# Patient Record
Sex: Female | Born: 1980 | Race: Black or African American | Hispanic: No | Smoking: Current every day smoker
Health system: Southern US, Community
[De-identification: ages and names within clinical notes are randomized; demographics above are authoritative.]

## PROBLEM LIST (undated history)

## (undated) HISTORY — PX: TUBAL LIGATION: SHX77

---

## 2006-06-20 ENCOUNTER — Emergency Department (HOSPITAL_COMMUNITY): Admission: EM | Admit: 2006-06-20 | Discharge: 2006-06-20 | Payer: Self-pay | Admitting: Emergency Medicine

## 2006-06-23 ENCOUNTER — Emergency Department (HOSPITAL_COMMUNITY): Admission: EM | Admit: 2006-06-23 | Discharge: 2006-06-23 | Payer: Self-pay | Admitting: Family Medicine

## 2007-01-01 ENCOUNTER — Ambulatory Visit (HOSPITAL_COMMUNITY): Admission: RE | Admit: 2007-01-01 | Discharge: 2007-01-01 | Payer: Self-pay | Admitting: Obstetrics & Gynecology

## 2007-02-02 ENCOUNTER — Ambulatory Visit (HOSPITAL_COMMUNITY): Admission: RE | Admit: 2007-02-02 | Discharge: 2007-02-02 | Payer: Self-pay | Admitting: Family Medicine

## 2007-03-02 ENCOUNTER — Ambulatory Visit (HOSPITAL_COMMUNITY): Admission: RE | Admit: 2007-03-02 | Discharge: 2007-03-02 | Payer: Self-pay | Admitting: Family Medicine

## 2007-03-18 ENCOUNTER — Ambulatory Visit (HOSPITAL_COMMUNITY): Admission: RE | Admit: 2007-03-18 | Discharge: 2007-03-18 | Payer: Self-pay | Admitting: Obstetrics & Gynecology

## 2007-04-30 ENCOUNTER — Inpatient Hospital Stay (HOSPITAL_COMMUNITY): Admission: AD | Admit: 2007-04-30 | Discharge: 2007-04-30 | Payer: Self-pay | Admitting: Family Medicine

## 2008-01-19 ENCOUNTER — Emergency Department (HOSPITAL_COMMUNITY): Admission: EM | Admit: 2008-01-19 | Discharge: 2008-01-19 | Payer: Self-pay | Admitting: Emergency Medicine

## 2008-07-09 ENCOUNTER — Emergency Department (HOSPITAL_COMMUNITY): Admission: EM | Admit: 2008-07-09 | Discharge: 2008-07-09 | Payer: Self-pay | Admitting: Emergency Medicine

## 2008-07-18 ENCOUNTER — Inpatient Hospital Stay (HOSPITAL_COMMUNITY): Admission: AD | Admit: 2008-07-18 | Discharge: 2008-07-18 | Payer: Self-pay | Admitting: Obstetrics & Gynecology

## 2008-08-03 ENCOUNTER — Inpatient Hospital Stay (HOSPITAL_COMMUNITY): Admission: AD | Admit: 2008-08-03 | Discharge: 2008-08-03 | Payer: Self-pay | Admitting: Family Medicine

## 2008-08-03 ENCOUNTER — Ambulatory Visit: Payer: Self-pay | Admitting: Obstetrics and Gynecology

## 2008-08-09 ENCOUNTER — Inpatient Hospital Stay (HOSPITAL_COMMUNITY): Admission: AD | Admit: 2008-08-09 | Discharge: 2008-08-09 | Payer: Self-pay | Admitting: Obstetrics & Gynecology

## 2008-08-19 ENCOUNTER — Emergency Department (HOSPITAL_COMMUNITY): Admission: EM | Admit: 2008-08-19 | Discharge: 2008-08-19 | Payer: Self-pay | Admitting: Emergency Medicine

## 2008-09-09 ENCOUNTER — Inpatient Hospital Stay (HOSPITAL_COMMUNITY): Admission: AD | Admit: 2008-09-09 | Discharge: 2008-09-09 | Payer: Self-pay | Admitting: Family Medicine

## 2008-10-25 ENCOUNTER — Ambulatory Visit (HOSPITAL_COMMUNITY): Admission: RE | Admit: 2008-10-25 | Discharge: 2008-10-25 | Payer: Self-pay | Admitting: Obstetrics & Gynecology

## 2008-12-06 ENCOUNTER — Emergency Department (HOSPITAL_COMMUNITY): Admission: EM | Admit: 2008-12-06 | Discharge: 2008-12-06 | Payer: Self-pay | Admitting: Emergency Medicine

## 2009-02-21 ENCOUNTER — Inpatient Hospital Stay (HOSPITAL_COMMUNITY): Admission: AD | Admit: 2009-02-21 | Discharge: 2009-02-21 | Payer: Self-pay | Admitting: Obstetrics & Gynecology

## 2009-02-21 ENCOUNTER — Ambulatory Visit: Payer: Self-pay | Admitting: Family Medicine

## 2009-03-22 ENCOUNTER — Inpatient Hospital Stay (HOSPITAL_COMMUNITY): Admission: AD | Admit: 2009-03-22 | Discharge: 2009-03-22 | Payer: Self-pay | Admitting: Obstetrics & Gynecology

## 2009-03-22 ENCOUNTER — Ambulatory Visit: Payer: Self-pay | Admitting: Advanced Practice Midwife

## 2009-03-23 ENCOUNTER — Inpatient Hospital Stay (HOSPITAL_COMMUNITY): Admission: AD | Admit: 2009-03-23 | Discharge: 2009-03-26 | Payer: Self-pay | Admitting: Obstetrics & Gynecology

## 2009-03-23 ENCOUNTER — Ambulatory Visit: Payer: Self-pay | Admitting: Family Medicine

## 2009-03-24 ENCOUNTER — Encounter: Payer: Self-pay | Admitting: Obstetrics & Gynecology

## 2010-08-21 LAB — CBC
HCT: 35.1 % — ABNORMAL LOW (ref 36.0–46.0)
Hemoglobin: 12 g/dL (ref 12.0–15.0)
MCV: 94.4 fL (ref 78.0–100.0)
RBC: 3.72 MIL/uL — ABNORMAL LOW (ref 3.87–5.11)
RDW: 13.5 % (ref 11.5–15.5)
RDW: 13.7 % (ref 11.5–15.5)
WBC: 8.7 10*3/uL (ref 4.0–10.5)

## 2010-08-21 LAB — RPR: RPR Ser Ql: NONREACTIVE

## 2010-08-21 LAB — RAPID URINE DRUG SCREEN, HOSP PERFORMED
Amphetamines: NOT DETECTED
Cocaine: NOT DETECTED
Tetrahydrocannabinol: NOT DETECTED

## 2010-08-22 LAB — CBC
HCT: 34.2 % — ABNORMAL LOW (ref 36.0–46.0)
Hemoglobin: 11.6 g/dL — ABNORMAL LOW (ref 12.0–15.0)
MCHC: 33.8 g/dL (ref 30.0–36.0)
MCV: 95.5 fL (ref 78.0–100.0)
RBC: 3.58 MIL/uL — ABNORMAL LOW (ref 3.87–5.11)

## 2010-08-22 LAB — URINALYSIS, ROUTINE W REFLEX MICROSCOPIC
Bilirubin Urine: NEGATIVE
Glucose, UA: NEGATIVE mg/dL
Hgb urine dipstick: NEGATIVE
Ketones, ur: NEGATIVE mg/dL
Protein, ur: NEGATIVE mg/dL
pH: 6.5 (ref 5.0–8.0)

## 2010-08-22 LAB — COMPREHENSIVE METABOLIC PANEL
ALT: 11 U/L (ref 0–35)
AST: 17 U/L (ref 0–37)
Albumin: 3 g/dL — ABNORMAL LOW (ref 3.5–5.2)
Alkaline Phosphatase: 51 U/L (ref 39–117)
Calcium: 9 mg/dL (ref 8.4–10.5)
Chloride: 104 mEq/L (ref 96–112)
Creatinine, Ser: 0.42 mg/dL (ref 0.4–1.2)
Sodium: 135 mEq/L (ref 135–145)
Total Bilirubin: 0.4 mg/dL (ref 0.3–1.2)

## 2010-08-28 LAB — ABO/RH: ABO/RH(D): A POS

## 2010-08-28 LAB — DRUG SCREEN PANEL (SERUM)
Amphetamine Scrn: NEGATIVE
Benzodiazepine Scrn: NEGATIVE
Methadone (Dolophine), Serum: NEGATIVE
Propoxyphene,Serum: NEGATIVE

## 2010-08-28 LAB — HCG, QUANTITATIVE, PREGNANCY: hCG, Beta Chain, Quant, S: 92799 m[IU]/mL — ABNORMAL HIGH (ref <5)

## 2010-08-28 LAB — ETHANOL: Alcohol, Ethyl (B): 128 mg/dL — ABNORMAL HIGH (ref 0–10)

## 2010-08-29 LAB — URINALYSIS, ROUTINE W REFLEX MICROSCOPIC
Bilirubin Urine: NEGATIVE
Glucose, UA: NEGATIVE mg/dL
Ketones, ur: NEGATIVE mg/dL
Nitrite: NEGATIVE
Protein, ur: NEGATIVE mg/dL
Protein, ur: NEGATIVE mg/dL
Urobilinogen, UA: 1 mg/dL (ref 0.0–1.0)
pH: 6 (ref 5.0–8.0)

## 2010-08-29 LAB — WET PREP, GENITAL
Clue Cells Wet Prep HPF POC: NONE SEEN
Trich, Wet Prep: NONE SEEN
Yeast Wet Prep HPF POC: NONE SEEN

## 2010-08-29 LAB — GC/CHLAMYDIA PROBE AMP, GENITAL
Chlamydia, DNA Probe: NEGATIVE
GC Probe Amp, Genital: NEGATIVE

## 2010-11-24 ENCOUNTER — Emergency Department: Payer: Self-pay | Admitting: Emergency Medicine

## 2010-12-27 ENCOUNTER — Emergency Department: Payer: Self-pay | Admitting: Emergency Medicine

## 2011-02-24 LAB — URINALYSIS, ROUTINE W REFLEX MICROSCOPIC
Ketones, ur: NEGATIVE
Nitrite: NEGATIVE
Protein, ur: NEGATIVE
Urobilinogen, UA: 0.2

## 2011-02-24 LAB — URINE MICROSCOPIC-ADD ON

## 2011-07-06 ENCOUNTER — Ambulatory Visit: Payer: Self-pay

## 2011-07-06 LAB — DRUG SCREEN, URINE
Amphetamines, Ur Screen: NEGATIVE (ref ?–1000)
Barbiturates, Ur Screen: NEGATIVE (ref ?–200)
Cocaine Metabolite,Ur ~~LOC~~: NEGATIVE (ref ?–300)
Tricyclic, Ur Screen: NEGATIVE (ref ?–1000)

## 2011-07-06 LAB — PREGNANCY, URINE: Pregnancy Test, Urine: NEGATIVE m[IU]/mL

## 2011-09-15 ENCOUNTER — Emergency Department: Payer: Self-pay | Admitting: Emergency Medicine

## 2011-09-15 LAB — URINALYSIS, COMPLETE
Nitrite: NEGATIVE
Ph: 5 (ref 4.5–8.0)
RBC,UR: 962 /HPF (ref 0–5)
Squamous Epithelial: 4
WBC UR: 52 /HPF (ref 0–5)

## 2011-09-15 LAB — COMPREHENSIVE METABOLIC PANEL
Calcium, Total: 8.3 mg/dL — ABNORMAL LOW (ref 8.5–10.1)
EGFR (Non-African Amer.): >60
Glucose: 87 mg/dL (ref 65–99)
Sodium: 142 mmol/L (ref 136–145)
Total Protein: 6.6 g/dL (ref 6.4–8.2)

## 2011-09-15 LAB — PREGNANCY, URINE: Pregnancy Test, Urine: NEGATIVE m[IU]/mL

## 2011-09-15 LAB — CBC
HCT: 35.2 % (ref 35.0–47.0)
MCV: 94 fL (ref 80–100)

## 2011-09-15 LAB — LIPASE, BLOOD: Lipase: 109 U/L (ref 73–393)

## 2012-07-17 ENCOUNTER — Emergency Department: Payer: Self-pay | Admitting: Emergency Medicine

## 2012-08-13 ENCOUNTER — Emergency Department: Payer: Self-pay | Admitting: Emergency Medicine

## 2012-08-13 LAB — URINALYSIS, COMPLETE
Blood: NEGATIVE
Ph: 7 (ref 4.5–8.0)
Squamous Epithelial: 5

## 2012-08-13 LAB — WET PREP, GENITAL

## 2012-09-05 ENCOUNTER — Emergency Department: Payer: Self-pay | Admitting: Emergency Medicine

## 2012-09-05 LAB — URINALYSIS, COMPLETE
Bilirubin,UR: NEGATIVE
Glucose,UR: NEGATIVE mg/dL (ref 0–75)
Nitrite: NEGATIVE
WBC UR: 1 /HPF (ref 0–5)

## 2012-09-05 LAB — WET PREP, GENITAL

## 2012-10-09 ENCOUNTER — Emergency Department: Payer: Self-pay | Admitting: Emergency Medicine

## 2012-10-09 LAB — WET PREP, GENITAL

## 2012-10-09 LAB — URINALYSIS, COMPLETE
Protein: NEGATIVE
Squamous Epithelial: 2
WBC UR: 2 /HPF (ref 0–5)

## 2012-11-10 ENCOUNTER — Emergency Department: Payer: Self-pay | Admitting: Internal Medicine

## 2013-01-11 ENCOUNTER — Observation Stay: Payer: Self-pay | Admitting: Internal Medicine

## 2013-01-11 LAB — BASIC METABOLIC PANEL
Chloride: 115 mmol/L — ABNORMAL HIGH (ref 98–107)
Co2: 24 mmol/L (ref 21–32)
EGFR (Non-African Amer.): >60
Glucose: 91 mg/dL (ref 65–99)
Sodium: 146 mmol/L — ABNORMAL HIGH (ref 136–145)

## 2013-01-11 LAB — URINALYSIS, COMPLETE
Bilirubin,UR: NEGATIVE
Glucose,UR: NEGATIVE mg/dL (ref 0–75)
Nitrite: NEGATIVE
WBC UR: 4 /HPF (ref 0–5)

## 2013-01-11 LAB — DRUG SCREEN, URINE
Amphetamines, Ur Screen: NEGATIVE (ref ?–1000)
Barbiturates, Ur Screen: NEGATIVE (ref ?–200)
Benzodiazepine, Ur Scrn: NEGATIVE (ref ?–200)
Cannabinoid 50 Ng, Ur ~~LOC~~: POSITIVE (ref ?–50)
MDMA (Ecstasy)Ur Screen: NEGATIVE (ref ?–500)
Opiate, Ur Screen: NEGATIVE (ref ?–300)

## 2013-01-11 LAB — ETHANOL: Ethanol %: 0.151 % — ABNORMAL HIGH (ref 0.000–0.080)

## 2013-01-11 LAB — CBC
HCT: 34.9 % — ABNORMAL LOW (ref 35.0–47.0)
MCV: 98 fL (ref 80–100)
WBC: 7.2 10*3/uL (ref 3.6–11.0)

## 2013-01-11 LAB — TROPONIN I: Troponin-I: <0.02 ng/mL

## 2013-01-12 LAB — CBC WITH DIFFERENTIAL/PLATELET
Basophil #: 0 10*3/uL (ref 0.0–0.1)
Basophil %: 0.5 %
Eosinophil #: 0.2 10*3/uL (ref 0.0–0.7)
Lymphocyte %: 36.3 %
MCV: 96 fL (ref 80–100)
Monocyte %: 7.4 %
Neutrophil #: 3.8 10*3/uL (ref 1.4–6.5)
Platelet: 154 10*3/uL (ref 150–440)
RBC: 3.27 10*6/uL — ABNORMAL LOW (ref 3.80–5.20)
RDW: 12.7 % (ref 11.5–14.5)
WBC: 7.1 10*3/uL (ref 3.6–11.0)

## 2013-01-12 LAB — COMPREHENSIVE METABOLIC PANEL
Alkaline Phosphatase: 31 U/L — ABNORMAL LOW (ref 50–136)
Co2: 25 mmol/L (ref 21–32)
Creatinine: 0.55 mg/dL — ABNORMAL LOW (ref 0.60–1.30)
EGFR (African American): >60
EGFR (Non-African Amer.): >60
Glucose: 105 mg/dL — ABNORMAL HIGH (ref 65–99)
Osmolality: 278 (ref 275–301)
SGOT(AST): 20 U/L (ref 15–37)
Sodium: 140 mmol/L (ref 136–145)
Total Protein: 5 g/dL — ABNORMAL LOW (ref 6.4–8.2)

## 2013-01-12 LAB — URINE CULTURE
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
## 2014-09-08 NOTE — Discharge Summary (Signed)
PATIENT NAME:  Alexandra Kennedy, Alexandra Kennedy MR#:  [id number] DATE OF BIRTH:  [birth year]  DATE OF ADMISSION:  01/11/2013 DATE OF DISCHARGE:  01/12/2013  ADMITTING DIAGNOSIS: Syncope.     DISCHARGE DIAGNOSES 1.  Recurrent syncope likely hypotension-related.  2.  Hypotension, suspected due to drugs, questionable muscle relaxant use. Tobacco, alcohol as well as cannabis abuse.  3.  Hyponatremia, likely due to dehydration, resolved on IV fluids.  4.  Metabolic encephalopathy due to drugs.  5.  Depression.  6.  Anemia of dehydration.  7.  Pyuria but no obvious urinary tract infection.    8.  Mood disorder, not otherwise specified, and polysubstance dependence.   DISCHARGE CONDITION: Stable.   DISCHARGE MEDICATIONS 1.  The patient is to resume ibuprofen 600 mg p.o. 3 times daily as needed.  2.  Iron with folic acid once daily.  3.  Chlordiazepoxide which is Librium 25 mg every 8 hours as needed.  4.  Nicotine oral inhaler 10 mg inhaler every 1 to 2 hours as needed.  5.  Nicotine transdermal patch 14 mg transdermally once daily.  6.  Folic acid 1 mg p.o. once daily.  7.  Thiamine 100 mg p.o. once daily.   HOME OXYGEN: None.   DIET: 2 grams salt.   ACTIVITY LIMITATIONS: As tolerated.    FOLLOWUP APPOINTMENT: [facility] in 2 days after discharge.   CONSULTANTS:  Dr. Brandy HaleUzma Faheem of psychiatry.  Care management.   RADIOLOGIC STUDIES: Chest x-ray, PA and lateral, 26th of August 2014, revealed no acute disease of the chest. CT scan of head without contrast 26th of August 2014, showed no evidence of acute intracranial abnormality. An echocardiogram on 26th of August 2014, revealed left ventricular ejection fraction by visual estimation 60% to 65%, normal global left ventricular systolic function.   HISTORY OF PRESENT ILLNESS:  The patient is a 34 year old African American female with past medical history significant for history of depression as well as anemia, who presented to the hospital with  complaints of a few syncopal episodes. Apparently, the patient was drinking alcohol and she syncopized a few times. She was brought to the Emergency Room where she was noted to be hypotensive. She was given IV fluids, however, her blood pressure did not improve so hospitalist services were contacted for admission. On arrival to the Emergency Room, immediately she was afebrile, temperature was 98.2, pulse was 81, respiratory rate was 20, blood pressure 91/55, saturations were 97% on room air. Physical exam was unremarkable.  The patient's lab data done in the Emergency Room showed elevation of sodium 146, otherwise BMP was unremarkable. Alcohol level was 151. The patient's cardiac enzymes x 3 were within normal limits. Urine drug screen was positive for cannabinoids. CBC: White blood cell count was 7.2, hemoglobin was 12.0, platelet count 156. Urinalysis revealed hazy urine, 3 red blood cells, trace leukocyte esterase, 4 white blood cells. Urine cultures showed no growth. Chest x-ray was unremarkable as well as CT of head.   HOSPITAL COURSE 1.  The patient was admitted to the hospital for further evaluation for her syncope. The patient had orthostatic vital signs checked for syncope and those were unremarkable. It was felt that patient's syncope could have been related to alcohol or substance abuse as well as possibly hypotension related to polysubstance abuse. The patient was rehydrated and had echocardiogram done which was unremarkable. It was felt that she is safe to be discharged home.  2.  In regards to hypotension, again it was felt  to be likely dehydration as well as medication-related or drug-related. She was rehydrated and her blood pressure improved. On the day of discharge, the patient's vital signs: Temperature is 98, pulse was 73, respiratory rate was 18, blood pressure 114/79, saturation was 99% to 100% on room air at rest.  3.  In regards to polysubstance, we consulted the specialist,  psychiatrist. Dr. Garnetta Buddy saw the patient in consultation on the 26th of August 2014, felt the patient had some mood disorder not otherwise specified as well as polysubstance dependence. She discussed with the patient about medications as well as use of drugs as well as alcohol; however, the patient was minimizing them.  She recommended to start the patient on Librium 25 mg p.o. every 8 hours for the next 2 days to help her to withdraw from alcohol. The patient, however, refused medications for anxiety or depression and declined referral to a rehabilitation facility and reported that she was using drugs only on p.r.n. basis. Dr. Garnetta Buddy felt that the patient is stable to be discharged home whenever she is medically stable.  4.  The patient had altered mental status, somnolence on arrival to the Emergency Room. Again, it was felt to be due to alcohol as well as drugs as well as hypotension.  Her metabolic encephalopathy resolved.  5.  In regards to anemia, we noted that she was more anemic with rehydration. Hemoglobin level drifted down from 12.0 to 10.9. However, no active bleeding was noted and guaiac was ordered, however, not obtained at the time of discharge. It is recommended to follow the patient's hemoglobin levels as outpatient and make decisions about evaluating her. She is, however, to continue her iron supplementation as she was previously on.  6.  In regards to her questionable urinary tract infection, it was felt that the patient has just pyuria. Urine cultures came back negative. No infection was noted and no antibiotic therapy was initiated.   The patient is being discharged in stable condition with the above-mentioned medications and followup. Her vital signs were stable.   TIME SPENT: 40 minutes.    ____________________________ Katharina Caper, MD rv:cs D: 01/12/2013 14:16:52 ET T: 01/12/2013 18:58:28 ET JOB#: [id number]  cc: Katharina Caper, MD, <Dictator> [facility]   Venita Seng  MD ELECTRONICALLY SIGNED 02/05/2013 18:00
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
## 2016-08-25 ENCOUNTER — Emergency Department
Admission: EM | Admit: 2016-08-25 | Discharge: 2016-08-25 | Disposition: A | Discharge status: Home / Self Care | Payer: Self-pay | Attending: Emergency Medicine | Admitting: Emergency Medicine

## 2016-08-25 ENCOUNTER — Encounter: Payer: Self-pay | Admitting: Emergency Medicine

## 2016-08-25 ENCOUNTER — Emergency Department: Payer: Self-pay

## 2016-08-25 DIAGNOSIS — M62838 Other muscle spasm: Secondary | ICD-10-CM | POA: Insufficient documentation

## 2016-08-25 DIAGNOSIS — M5412 Radiculopathy, cervical region: Secondary | ICD-10-CM | POA: Insufficient documentation

## 2016-08-25 DIAGNOSIS — F17200 Nicotine dependence, unspecified, uncomplicated: Secondary | ICD-10-CM | POA: Insufficient documentation

## 2016-08-25 MED ORDER — CYCLOBENZAPRINE HCL 10 MG PO TABS: 10.0000 mg | ORAL_TABLET | Freq: Three times a day (TID) | ORAL | 0 refills | Status: DC | PRN

## 2016-08-25 MED ORDER — METHYLPREDNISOLONE 4 MG PO TBPK: ORAL_TABLET | ORAL | 0 refills | Status: DC

## 2016-08-25 MED ORDER — TRAMADOL HCL 50 MG PO TABS: 50.0000 mg | ORAL_TABLET | Freq: Four times a day (QID) | ORAL | 0 refills | Status: DC | PRN

## 2016-08-25 NOTE — ED Triage Notes (Signed)
States she developed pain to right side of neck which radiates into right arm for the past 3 days   Denies any injury

## 2016-08-25 NOTE — ED Provider Notes (Signed)
[facility] Emergency Department Provider Note   ____________________________________________   First MD Initiated Contact with Patient 08/25/16 1243     (approximate)  I have reviewed the triage vital signs and the nursing notes.   HISTORY  Chief Complaint Neck Pain    HPI Alexandra Boltz is a 36 y.o. female patient complaining of radicular neck pain to the upper extremity for 3 days. Patient denies any provocative incident for her complaint.She denies pain but states the discomfort from the tingling and numbness as 8/10. No palliative measures taken for this complaint.   History reviewed. No pertinent past medical history.  There are no active problems to display for this patient.   History reviewed. No pertinent surgical history.  Prior to Admission medications   Medication Sig Start Date End Date Taking? Authorizing Provider  cyclobenzaprine (FLEXERIL) 10 MG tablet Take 1 tablet (10 mg total) by mouth 3 (three) times daily as needed. 08/25/16   Joni Reining, PA-C  methylPREDNISolone (MEDROL DOSEPAK) 4 MG TBPK tablet Take Tapered dose as directed 08/25/16   Joni Reining, PA-C  traMADol (ULTRAM) 50 MG tablet Take 1 tablet (50 mg total) by mouth every 6 (six) hours as needed for moderate pain. 08/25/16   Joni Reining, PA-C    Allergies Patient has no known allergies.  No family history on file.  Social History Social History  Substance Use Topics  . Smoking status: Current Every Day Smoker  . Smokeless tobacco: Never Used  . Alcohol use Yes    Review of Systems Constitutional: No fever/chills Eyes: No visual changes. ENT: No sore throat. Cardiovascular: Denies chest pain. Respiratory: Denies shortness of breath. Gastrointestinal: No abdominal pain.  No nausea, no vomiting.  No diarrhea.  No constipation. Genitourinary: Negative for dysuria. Musculoskeletal: Negative for back pain. Skin: Negative for rash. Neurological: Negative for  headaches or focal weakness. Numbness to the right upper extremity    ____________________________________________   PHYSICAL EXAM:  VITAL SIGNS: ED Triage Vitals  Enc Vitals Group     BP 08/25/16 1241 (!) 95/57     Pulse Rate 08/25/16 1241 87     Resp 08/25/16 1241 15     Temp 08/25/16 1241 98.3 F (36.8 C)     Temp Source 08/25/16 1241 Oral     SpO2 08/25/16 1241 100 %     Weight 08/25/16 1224 138 lb (62.6 kg)     Height 08/25/16 1224  (1.651 m)     Head Circumference --      Peak Flow --      Pain Score 08/25/16 1223 8     Pain Loc --      Pain Edu? --      Excl. in GC? --     Constitutional: Alert and oriented. Well appearing and in no acute distress. Eyes: Conjunctivae are normal. PERRL. EOMI. Head: Atraumatic. Nose: No congestion/rhinnorhea. Mouth/Throat: Mucous membranes are moist.  Oropharynx non-erythematous. Neck: No stridor.  No cervical spine tenderness to palpation. Hematological/Lymphatic/Immunilogical: No cervical lymphadenopathy. Cardiovascular: Normal rate, regular rhythm. Grossly normal heart sounds.  Good peripheral circulation. Respiratory: Normal respiratory effort.  No retractions. Lungs CTAB. Gastrointestinal: Soft and nontender. No distention. No abdominal bruits. No CVA tenderness. Musculoskeletal: No lower extremity tenderness nor edema.  No joint effusions. Neurologic:  Normal speech and language. No gross focal neurologic deficits are appreciated. No gait instability. Skin:  Skin is warm, dry and intact. No rash noted. Psychiatric: Mood and affect are  normal. Speech and behavior are normal.  ____________________________________________   LABS (all labs ordered are listed, but only abnormal results are displayed)  Labs Reviewed - No data to display ____________________________________________  EKG   ____________________________________________  RADIOLOGY   __. Acute findings. There is reverse of the cervical lordosis with  moderate degenerative changes in the cervical disc.. __________________________________________   PROCEDURES  Procedure(s) performed: None  Procedures  Critical Care performed: No  ____________________________________________   INITIAL IMPRESSION / ASSESSMENT AND PLAN / ED COURSE  Pertinent labs & imaging results that were available during my care of the patient were reviewed by me and considered in my medical decision making (see chart for details).  Radicular neck pain to right upper extremity. X-rays pending.      ____________________________________________   FINAL CLINICAL IMPRESSION(S) / ED DIAGNOSES  Final diagnoses:  Cervical radiculopathy  Muscle spasms of neck  Patient given discharge care instructions. Patient given a prescription for tramadol, Flexeril, Medrol Dosepak. Patient given a work note and advised follow-up with family doctor for continued care.    NEW MEDICATIONS STARTED DURING THIS VISIT:  New Prescriptions   CYCLOBENZAPRINE (FLEXERIL) 10 MG TABLET    Take 1 tablet (10 mg total) by mouth 3 (three) times daily as needed.   METHYLPREDNISOLONE (MEDROL DOSEPAK) 4 MG TBPK TABLET    Take Tapered dose as directed   TRAMADOL (ULTRAM) 50 MG TABLET    Take 1 tablet (50 mg total) by mouth every 6 (six) hours as needed for moderate pain.     Note:  This document was prepared using Dragon voice recognition software and may include unintentional dictation errors.    Joni Reining, PA-C 08/25/16 1357    Rockne Menghini, MD 08/25/16 1524

## 2017-05-14 ENCOUNTER — Emergency Department (HOSPITAL_COMMUNITY)
Admission: EM | Admit: 2017-05-14 | Discharge: 2017-05-14 | Disposition: A | Discharge status: Home / Self Care | Payer: Self-pay | Attending: Emergency Medicine | Admitting: Emergency Medicine

## 2017-05-14 ENCOUNTER — Encounter (HOSPITAL_COMMUNITY): Payer: Self-pay | Admitting: Emergency Medicine

## 2017-05-14 ENCOUNTER — Other Ambulatory Visit: Payer: Self-pay

## 2017-05-14 DIAGNOSIS — N898 Other specified noninflammatory disorders of vagina: Secondary | ICD-10-CM

## 2017-05-14 DIAGNOSIS — Z202 Contact with and (suspected) exposure to infections with a predominantly sexual mode of transmission: Secondary | ICD-10-CM | POA: Insufficient documentation

## 2017-05-14 DIAGNOSIS — N760 Acute vaginitis: Secondary | ICD-10-CM | POA: Insufficient documentation

## 2017-05-14 DIAGNOSIS — F17210 Nicotine dependence, cigarettes, uncomplicated: Secondary | ICD-10-CM | POA: Insufficient documentation

## 2017-05-14 DIAGNOSIS — Z79899 Other long term (current) drug therapy: Secondary | ICD-10-CM | POA: Insufficient documentation

## 2017-05-14 DIAGNOSIS — B9689 Other specified bacterial agents as the cause of diseases classified elsewhere: Secondary | ICD-10-CM

## 2017-05-14 LAB — URINALYSIS, COMPLETE (UACMP) WITH MICROSCOPIC
BACTERIA UA: NONE SEEN
BILIRUBIN URINE: NEGATIVE
Glucose, UA: NEGATIVE mg/dL
HGB URINE DIPSTICK: NEGATIVE
KETONES UR: 5 mg/dL — AB
LEUKOCYTES UA: NEGATIVE
NITRITE: NEGATIVE
PH: 6 (ref 5.0–8.0)
Protein, ur: NEGATIVE mg/dL
SPECIFIC GRAVITY, URINE: 1.021 (ref 1.005–1.030)

## 2017-05-14 LAB — WET PREP, GENITAL
SPERM: NONE SEEN
TRICH WET PREP: NONE SEEN
Yeast Wet Prep HPF POC: NONE SEEN

## 2017-05-14 LAB — PREGNANCY, URINE: Preg Test, Ur: NEGATIVE

## 2017-05-14 MED ORDER — TERCONAZOLE 0.4 % VA CREA: 1.0000 | TOPICAL_CREAM | Freq: Every day | VAGINAL | 0 refills | Status: DC

## 2017-05-14 MED ORDER — LIDOCAINE HCL (PF) 1 % IJ SOLN
INTRAMUSCULAR | Status: AC
  Administered 2017-05-14: 5 mL
  Filled 2017-05-14: qty 5

## 2017-05-14 MED ORDER — FLUCONAZOLE 150 MG PO TABS: 150.0000 mg | ORAL_TABLET | Freq: Once | ORAL | Status: DC

## 2017-05-14 MED ORDER — CEFTRIAXONE SODIUM 250 MG IJ SOLR
250.0000 mg | Freq: Once | INTRAMUSCULAR | Status: AC
  Administered 2017-05-14: 250 mg via INTRAMUSCULAR
  Filled 2017-05-14: qty 250

## 2017-05-14 MED ORDER — METRONIDAZOLE 500 MG PO TABS: 500.0000 mg | ORAL_TABLET | Freq: Two times a day (BID) | ORAL | 0 refills | Status: DC

## 2017-05-14 MED ORDER — AZITHROMYCIN 250 MG PO TABS
1000.0000 mg | ORAL_TABLET | Freq: Once | ORAL | Status: AC
  Administered 2017-05-14: 1000 mg via ORAL
  Filled 2017-05-14: qty 4

## 2017-05-14 NOTE — Discharge Instructions (Signed)
Someone will call you if your cultures come back positive. Follow up with the health department for additional STD screening.

## 2017-05-14 NOTE — ED Notes (Signed)
Awaiting pharmacy verification on fluconazole

## 2017-05-14 NOTE — ED Triage Notes (Signed)
Pt states she had sex a few weeks ago and states she now has had abnormal discharge for 1 week and itching.

## 2017-05-14 NOTE — ED Provider Notes (Signed)
[facility] Provider Note   CSN: [id number] Arrival date & time: 05/14/17  1656     History   Chief Complaint Chief Complaint  Patient presents with  . SEXUALLY TRANSMITTED DISEASE    HPI Alexandra Kennedy is a 36 y.o. female who presents to the ED for STD check. Patient reports that she had sex a few weeks ago and now has abnormal vaginal discharge and itching that started a week ago and has gotten worse. Patient describes the discharge as thick white. G5 P4, SAB x1. Hx of trichomonas. Current sex partner x 4 months.  HPI  History reviewed. No pertinent past medical history.  There are no active problems to display for this patient.   History reviewed. No pertinent surgical history.  OB History    No data available       Home Medications    Prior to Admission medications   Medication Sig Start Date End Date Taking? Authorizing Provider  cyclobenzaprine (FLEXERIL) 10 MG tablet Take 1 tablet (10 mg total) by mouth 3 (three) times daily as needed. 08/25/16   Joni ReiningSmith, Ronald K, PA-C  methylPREDNISolone (MEDROL DOSEPAK) 4 MG TBPK tablet Take Tapered dose as directed 08/25/16   Joni ReiningSmith, Ronald K, PA-C  metroNIDAZOLE (FLAGYL) 500 MG tablet Take 1 tablet (500 mg total) by mouth 2 (two) times daily. 05/14/17   Janne NapoleonNeese, Mialani Reicks M, NP  traMADol (ULTRAM) 50 MG tablet Take 1 tablet (50 mg total) by mouth every 6 (six) hours as needed for moderate pain. 08/25/16   Joni ReiningSmith, Ronald K, PA-C    Family History No family history on file.  Social History Social History   Tobacco Use  . Smoking status: Current Every Day Smoker  . Smokeless tobacco: Never Used  Substance Use Topics  . Alcohol use: Yes  . Drug use: Not on file     Allergies   Patient has no known allergies.   Review of Systems Review of Systems  Genitourinary: Positive for vaginal discharge. Vaginal pain: itching.  Musculoskeletal: Negative for back pain.  All other systems reviewed and  are negative.    Physical Exam Updated Vital Signs Ht 5' 5.5" (1.664 m)   Wt 64.9 kg (143 lb)   LMP 04/18/2017   BMI 23.43 kg/m   Physical Exam  Constitutional: She is oriented to person, place, and time. She appears well-developed and well-nourished. No distress.  HENT:  Head: Normocephalic.  Eyes: EOM are normal.  Neck: Neck supple.  Cardiovascular: Normal rate.  Pulmonary/Chest: Effort normal.  Abdominal: Soft. There is no tenderness.  Genitourinary:  Genitourinary Comments: External genitalia without lesions, thick, white, cheesy d/c vaginal vault. Mild CMT, no adnexal mass or tenderness. Uterus without palpable enlargement.   Musculoskeletal: Normal range of motion.  Neurological: She is alert and oriented to person, place, and time. No cranial nerve deficit.  Skin: Skin is warm and dry.  Psychiatric: She has a normal mood and affect.  Nursing note and vitals reviewed.    ED Treatments / Results  Labs (all labs ordered are listed, but only abnormal results are displayed) Labs Reviewed  WET PREP, GENITAL - Abnormal; Notable for the following components:      Result Value   Clue Cells Wet Prep HPF POC PRESENT (*)    WBC, Wet Prep HPF POC MODERATE (*)    All other components within normal limits  URINALYSIS, COMPLETE (UACMP) WITH MICROSCOPIC - Abnormal; Notable for the following components:   Ketones, ur  5 (*)    Squamous Epithelial / LPF 0-5 (*)    All other components within normal limits  PREGNANCY, URINE  RPR  HIV ANTIBODY (ROUTINE TESTING)  GC/CHLAMYDIA PROBE AMP ([facility]    Radiology No results found.  Procedures Procedures (including critical care time)  Medications Ordered in ED Medications  fluconazole (DIFLUCAN) tablet 150 mg (not administered)  cefTRIAXone (ROCEPHIN) injection 250 mg (not administered)  azithromycin (ZITHROMAX) tablet 1,000 mg (not administered)     Initial Impression / Assessment and Plan / ED Course  I  have reviewed the triage vital signs and the nursing notes. Pt presents with concerns for possible STD.  Pt understands that they have GC/Chlamydia cultures pending and that they will need to inform all sexual partners if results return positive. Pt has been treated prophylactically with azithromycin and Rocephin due to pts history, pelvic exam, and wet prep with increased WBCs.  Pt not concerning for PID because hemodynamically stable and minimal cervical motion tenderness on pelvic exam. Pt has also been treated with Flagyl for Bacterial Vaginosis. Pt has been advised to not drink alcohol while on this medication.  Patient to be discharged with instructions to follow up with [facility]. Discussed importance of using protection when sexually active.   Final Clinical Impressions(s) / ED Diagnoses   Final diagnoses:  BV (bacterial vaginosis)  Vaginal discharge  Possible exposure to STD    ED Discharge Orders        Ordered    metroNIDAZOLE (FLAGYL) 500 MG tablet  2 times daily     05/14/17 1759       Kerrie Buffaloeese, Amine Adelson [place], NP 05/14/17 1806    Jacalyn LefevreHaviland, Julie, MD 05/14/17 [phone]

## 2017-05-15 LAB — GC/CHLAMYDIA PROBE AMP (~~LOC~~) NOT AT ARMC
Chlamydia: NEGATIVE
Neisseria Gonorrhea: NEGATIVE

## 2017-05-15 LAB — HIV ANTIBODY (ROUTINE TESTING W REFLEX): HIV SCREEN 4TH GENERATION: NONREACTIVE

## 2017-05-15 LAB — RPR: RPR: NONREACTIVE

## 2018-06-17 ENCOUNTER — Emergency Department (HOSPITAL_COMMUNITY)
Admission: EM | Admit: 2018-06-17 | Discharge: 2018-06-17 | Disposition: A | Discharge status: Home / Self Care | Payer: Self-pay | Attending: Emergency Medicine | Admitting: Emergency Medicine

## 2018-06-17 ENCOUNTER — Encounter (HOSPITAL_COMMUNITY): Payer: Self-pay | Admitting: Emergency Medicine

## 2018-06-17 ENCOUNTER — Other Ambulatory Visit: Payer: Self-pay

## 2018-06-17 DIAGNOSIS — J029 Acute pharyngitis, unspecified: Secondary | ICD-10-CM | POA: Insufficient documentation

## 2018-06-17 DIAGNOSIS — Z79899 Other long term (current) drug therapy: Secondary | ICD-10-CM | POA: Insufficient documentation

## 2018-06-17 DIAGNOSIS — F17210 Nicotine dependence, cigarettes, uncomplicated: Secondary | ICD-10-CM | POA: Insufficient documentation

## 2018-06-17 LAB — GROUP A STREP BY PCR: Group A Strep by PCR: NOT DETECTED

## 2018-06-17 MED ORDER — DEXAMETHASONE SODIUM PHOSPHATE 4 MG/ML IJ SOLN
4.0000 mg | Freq: Once | INTRAMUSCULAR | Status: AC
  Administered 2018-06-17: 4 mg via INTRAMUSCULAR
  Filled 2018-06-17: qty 1

## 2018-06-17 NOTE — ED Provider Notes (Signed)
[facility] EMERGENCY DEPARTMENT Provider Note   CSN: [id number] Arrival date & time: 06/17/18  0909     History   Chief Complaint No chief complaint on file.   HPI Alexandra Kennedy is a 38 y.o. female.  38 y.o female with no PMH presents to the ED with a chief complaint of sore throat x 1 week.  Describes a feeling of "burning sensation" down her throat, she reports this is worse with swallowing her own secretions or eating.  She has tried tea, gargle water and salt, sinus pills and reports no relieving symptoms.  Patient has 2 kids at home but reports neither of them have been sick recently.  She denies any shortness of breath, chest pain, fever, neck pain or rigidity.      OB History   No obstetric history on file.      Home Medications    Prior to Admission medications   Medication Sig Start Date End Date Taking? Authorizing Provider  cyclobenzaprine (FLEXERIL) 10 MG tablet Take 1 tablet (10 mg total) by mouth 3 (three) times daily as needed. 08/25/16   Joni Reining, PA-C  methylPREDNISolone (MEDROL DOSEPAK) 4 MG TBPK tablet Take Tapered dose as directed 08/25/16   Joni Reining, PA-C  metroNIDAZOLE (FLAGYL) 500 MG tablet Take 1 tablet (500 mg total) by mouth 2 (two) times daily. 05/14/17   Janne Napoleon, NP  terconazole (TERAZOL 7) 0.4 % vaginal cream Place 1 applicator vaginally at bedtime. 05/14/17   Janne Napoleon, NP  traMADol (ULTRAM) 50 MG tablet Take 1 tablet (50 mg total) by mouth every 6 (six) hours as needed for moderate pain. 08/25/16   Joni Reining, PA-C    Family History No family history on file.  Social History Social History   Tobacco Use  . Smoking status: Current Every Day Smoker    Types: Cigarettes  . Smokeless tobacco: Never Used  Substance Use Topics  . Alcohol use: Yes  . Drug use: Not on file     Allergies   Patient has no known allergies.   Review of Systems Review of Systems  Constitutional: Negative for  fever.  HENT: Positive for rhinorrhea, sinus pressure and sore throat.   Respiratory: Negative for shortness of breath.   Cardiovascular: Negative for chest pain.     Physical Exam Updated Vital Signs BP 109/77 (BP Location: Right Arm)   Pulse 87   Temp 98.1 F (36.7 C) (Oral)   Resp 17   Ht 5\' 5"  (1.651 m)   Wt 67.6 kg   SpO2 99%   BMI 24.79 kg/m   Physical Exam Vitals signs and nursing note reviewed.  Constitutional:      General: She is not in acute distress.    Appearance: She is well-developed.  HENT:     Head: Normocephalic and atraumatic.     Nose:     Right Turbinates: Enlarged and swollen.     Left Turbinates: Enlarged and swollen.     Right Sinus: Maxillary sinus tenderness and frontal sinus tenderness present.     Left Sinus: Maxillary sinus tenderness and frontal sinus tenderness present.     Mouth/Throat:     Mouth: Mucous membranes are moist.     Pharynx: Uvula midline. Posterior oropharyngeal erythema present. No pharyngeal swelling or oropharyngeal exudate.     Tonsils: No tonsillar exudate or tonsillar abscesses. Swelling: 2+ on the right. 2+ on the left.  Eyes:  Pupils: Pupils are equal, round, and reactive to light.  Neck:     Musculoskeletal: Normal range of motion.  Cardiovascular:     Rate and Rhythm: Regular rhythm.     Heart sounds: Normal heart sounds.  Pulmonary:     Effort: Pulmonary effort is normal. No respiratory distress.     Breath sounds: Normal breath sounds.  Abdominal:     General: Bowel sounds are normal. There is no distension.     Palpations: Abdomen is soft.     Tenderness: There is no abdominal tenderness.  Musculoskeletal:        General: No tenderness or deformity.     Right lower leg: No edema.     Left lower leg: No edema.  Skin:    General: Skin is warm and dry.  Neurological:     Mental Status: She is alert and oriented to person, place, and time.      ED Treatments / Results  Labs (all labs ordered are  listed, but only abnormal results are displayed) Labs Reviewed  GROUP A STREP BY PCR    EKG None  Radiology No results found.  Procedures Procedures (including critical care time)  Medications Ordered in ED Medications - No data to display   Initial Impression / Assessment and Plan / ED Course  I have reviewed the triage vital signs and the nursing notes.  Pertinent labs & imaging results that were available during my care of the patient were reviewed by me and considered in my medical decision making (see chart for details).    Patient presents with a chief complaint of sore throat x 1 week. Oropharynx is erythematous and swollen along with some cervical lymphadenopathy some suspicion for strep pharyngitis, will obtain PCR strep.   PCR strep was negative for pharyngitis, due to patient's swelling and discomfort will give decadron to decrease swelling and pain. No Tonsillar abscess noted on exam.   No Trismus, drooling or unable to tolerate secretions, Stridor or tripod low suspicion for any further emergent work-up. No difficulty swallowing or hoarseness.  Patient will be treated with symptomatic treatment at home.  Precautions have been discussed at length with patient along with results of strep testing.  Patient understands and agrees with plan.  Return precautions provided.   Final Clinical Impressions(s) / ED Diagnoses   Final diagnoses:  Sore throat    ED Discharge Orders    None       Claude Manges, PA-C 06/17/18 1106    Virgina Norfolk, DO 06/17/18 1725

## 2018-06-17 NOTE — ED Notes (Signed)
Patient verbalizes understanding of discharge instructions. Opportunity for questioning and answers were provided. Armband removed by staff, pt discharged from ED. Pt ambulatory to lobby.  

## 2018-06-17 NOTE — ED Triage Notes (Signed)
Pt complains of sore throat x1 week. Pt states it hurts to swallow.

## 2018-06-17 NOTE — Discharge Instructions (Addendum)
Your strep test today was negative. You are likely suffering from a viral upper respiratory infection. You may treat your symptoms with cough syrup and tylenol for fever. Please return to the ED if you are unable tolerate your secretion, difficulty swallowing

## 2018-06-17 NOTE — ED Notes (Signed)
Pt given water to drink. 

## 2018-07-25 ENCOUNTER — Emergency Department (HOSPITAL_COMMUNITY)
Admission: EM | Admit: 2018-07-25 | Discharge: 2018-07-25 | Disposition: A | Discharge status: Home / Self Care | Payer: Self-pay | Attending: Emergency Medicine | Admitting: Emergency Medicine

## 2018-07-25 ENCOUNTER — Other Ambulatory Visit: Payer: Self-pay

## 2018-07-25 ENCOUNTER — Encounter (HOSPITAL_COMMUNITY): Payer: Self-pay | Admitting: *Deleted

## 2018-07-25 DIAGNOSIS — Z23 Encounter for immunization: Secondary | ICD-10-CM | POA: Insufficient documentation

## 2018-07-25 DIAGNOSIS — Z79899 Other long term (current) drug therapy: Secondary | ICD-10-CM | POA: Insufficient documentation

## 2018-07-25 DIAGNOSIS — F17210 Nicotine dependence, cigarettes, uncomplicated: Secondary | ICD-10-CM | POA: Insufficient documentation

## 2018-07-25 DIAGNOSIS — N750 Cyst of Bartholin's gland: Secondary | ICD-10-CM | POA: Insufficient documentation

## 2018-07-25 MED ORDER — TETANUS-DIPHTH-ACELL PERTUSSIS 5-2.5-18.5 LF-MCG/0.5 IM SUSP
0.5000 mL | Freq: Once | INTRAMUSCULAR | Status: AC
  Administered 2018-07-25: 0.5 mL via INTRAMUSCULAR
  Filled 2018-07-25: qty 0.5

## 2018-07-25 MED ORDER — LIDOCAINE HCL (PF) 1 % IJ SOLN
5.0000 mL | Freq: Once | INTRAMUSCULAR | Status: AC
  Administered 2018-07-25: 5 mL
  Filled 2018-07-25: qty 5

## 2018-07-25 NOTE — ED Triage Notes (Signed)
Pt reports abscess  started  On Friday.  Site is on the rt side of Vagina . Pain is 10/10

## 2018-07-25 NOTE — Discharge Instructions (Signed)
Your evaluated today for suture abscess.  He did have this area drained.  You have a catheter placed to this area.  This may fall out over the next 2 days.  I have referred you to OB/GYN.  Please follow-up in the next 1 to 2 days for reevaluation for removal of this catheter as well as wound check.  This is a walk-in clinic.  You do not need to schedule an appointment.  They are open from 4 PM to 7:30 PM Monday through Wednesday.  Their contact information is listed on your discharge paperwork.

## 2018-07-25 NOTE — ED Provider Notes (Signed)
[facility] EMERGENCY DEPARTMENT Provider Note   CSN: [id number] Arrival date & time: 07/25/18  1645  History   Chief Complaint Chief Complaint  Patient presents with  . Recurrent Skin Infections    HPI Alexandra Kennedy is a 38 y.o. female with no significant past medical history who presents for evaluation of abscess.  Patient states she has had pain and swelling to her right labia which began 3 days PTL.  Rated a 10/10.  Pain does not radiate.  Denies skin infections.  Patient states her sister looked and told her "it looks like an abscess."  Has not taken anything for pain PTA.  Denies drainage from the area.  Has been using home sitz bath without relief of symptoms.  Not have an OB/GYN currently.  Unsure of last tetanus.  Pain is chronic.  Denies additional aggravating or alleviating factors.  Has not taken any p.o. medications for pain PTA.  No fever, chills, nausea, vomiting, cough, shortness of breath, abdominal pain, dysuria, vaginal discharge, abnormal vaginal bleeding, pelvic pain.  History obtained from patient.  No interpreter was used.     HPI  History reviewed. No pertinent past medical history.  There are no active problems to display for this patient.   History reviewed. No pertinent surgical history.   OB History   No obstetric history on file.      Home Medications    Prior to Admission medications   Medication Sig Start Date End Date Taking? Authorizing Provider  cyclobenzaprine (FLEXERIL) 10 MG tablet Take 1 tablet (10 mg total) by mouth 3 (three) times daily as needed. 08/25/16   Joni Reining, PA-C  methylPREDNISolone (MEDROL DOSEPAK) 4 MG TBPK tablet Take Tapered dose as directed 08/25/16   Joni Reining, PA-C  metroNIDAZOLE (FLAGYL) 500 MG tablet Take 1 tablet (500 mg total) by mouth 2 (two) times daily. 05/14/17   Janne Napoleon, NP  terconazole (TERAZOL 7) 0.4 % vaginal cream Place 1 applicator vaginally at bedtime. 05/14/17   Janne Napoleon, NP  traMADol (ULTRAM) 50 MG tablet Take 1 tablet (50 mg total) by mouth every 6 (six) hours as needed for moderate pain. 08/25/16   Joni Reining, PA-C    Family History History reviewed. No pertinent family history.  Social History Social History   Tobacco Use  . Smoking status: Current Every Day Smoker    Types: Cigarettes  . Smokeless tobacco: Never Used  Substance Use Topics  . Alcohol use: Yes  . Drug use: Not Currently     Allergies   Patient has no known allergies.   Review of Systems Review of Systems  Constitutional: Negative.   HENT: Negative.   Respiratory: Negative.   Cardiovascular: Negative.   Gastrointestinal: Negative.   Genitourinary: Negative.   Musculoskeletal: Negative.   Skin:       Swelling to right labia.  Neurological: Negative.   All other systems reviewed and are negative.    Physical Exam Updated Vital Signs BP 102/66   Pulse 75   Temp 98.3 F (36.8 C) (Oral)   Resp 18   Ht 5' 5.5" (1.664 m)   Wt 66.7 kg   LMP 07/25/2018   SpO2 100%   BMI 24.09 kg/m   Physical Exam Vitals signs and nursing note reviewed. Exam conducted with a chaperone present.  Constitutional:      General: She is not in acute distress.    Appearance: She is well-developed. She is not  ill-appearing, toxic-appearing or diaphoretic.  HENT:     Head: Normocephalic and atraumatic.     Nose: Nose normal.     Mouth/Throat:     Mouth: Mucous membranes are moist.     Pharynx: Oropharynx is clear.  Eyes:     Pupils: Pupils are equal, round, and reactive to light.  Neck:     Musculoskeletal: Normal range of motion.  Cardiovascular:     Rate and Rhythm: Normal rate.     Pulses: Normal pulses.     Heart sounds: Normal heart sounds.  Pulmonary:     Effort: Pulmonary effort is normal. No respiratory distress.     Breath sounds: Normal breath sounds.  Abdominal:     General: Bowel sounds are normal. There is no distension.     Tenderness: There is no  abdominal tenderness. There is no guarding or rebound.  Genitourinary:    Comments: Normal appearing external female genitalia without rashes or lesions. Moderate Bartholin cyst to right labia. Area of fluctuance noted. No induration. No erythema or warmth. Normal appearing cervix without discharge or petechiae. Cervical os is closed. There is no bleeding noted at the os.Moderate Odor. No palpable adnexal masses or tenderness. Uterus midline and not fixed. Rectovaginal exam was deferred.  No cystocele or rectocele noted. No pelvic lymphadenopathy noted. Exam performed with chaperone in room. Musculoskeletal: Normal range of motion.     Comments: Moves all 4 extremities without difficulty.  Ambulatory in department without difficulty.  Skin:    General: Skin is warm and dry.     Comments: Brisk Capillary refill, no erythema or warmth.  No induration.  Neurological:     Mental Status: She is alert.    ED Treatments / Results  Labs (all labs ordered are listed, but only abnormal results are displayed) Labs Reviewed - No data to display  EKG None  Radiology No results found.  Procedures .Marland KitchenIncision and Drainage Date/Time: 07/25/2018 6:28 PM Performed by: Linwood Dibbles, PA-C Authorized by: Linwood Dibbles, PA-C   Consent:    Consent obtained:  Verbal   Consent given by:  Patient   Risks discussed:  Bleeding, incomplete drainage, pain and damage to other organs   Alternatives discussed:  No treatment Universal protocol:    Procedure explained and questions answered to patient or proxy's satisfaction: yes     Relevant documents present and verified: yes     Test results available and properly labeled: yes     Imaging studies available: yes     Required blood products, implants, devices, and special equipment available: yes     Site/side marked: yes     Immediately prior to procedure a time out was called: yes     Patient identity confirmed:  Verbally with patient Location:     Type:  Bartholin cyst   Location:  Anogenital   Anogenital location:  Bartholin's gland Pre-procedure details:    Skin preparation:  Betadine Anesthesia (see MAR for exact dosages):    Anesthesia method:  Local infiltration   Local anesthetic:  Lidocaine 1% w/o epi Procedure type:    Complexity:  Complex Procedure details:    Incision types:  Single straight   Incision depth:  Subcutaneous   Scalpel blade:  11   Wound management:  Probed and deloculated, irrigated with saline and extensive cleaning   Drainage:  Purulent   Drainage amount:  Moderate   Wound treatment:  Drain placed   Packing materials:  1/4 in gauze  and word catheter Post-procedure details:    Patient tolerance of procedure:  Tolerated well, no immediate complications   (including critical care time)  Medications Ordered in ED Medications  lidocaine (PF) (XYLOCAINE) 1 % injection 5 mL (5 mLs Infiltration Given 07/25/18 1744)  Tdap (BOOSTRIX) injection 0.5 mL (0.5 mLs Intramuscular Given 07/25/18 1744)    Initial Impression / Assessment and Plan / ED Course  I have reviewed the triage vital signs and the nursing notes.  Pertinent labs & imaging results that were available during my care of the patient were reviewed by me and considered in my medical decision making (see chart for details).  38 year old female who is otherwise well presents for evaluation of right labial swelling.  Afebrile, nonseptic, non-ill-appearing.  Swelling to right labia with fluctuance. No induration. No erythema or warmth.  Consistent with Bartholin cyst with overlying abscess. See procedure note. Procedure Tolerated well.  Word catheter placed.  Moderate drainage.  No systemic symptoms. Will have patient follow-up with OB/GYN in 2 days for reevaluation.  Patient given resources for walk-in clinic as she does not have OB/GYN follow-up. Encouraged home warm soaks and flushing.  No surrounding cellulitis. No antibiotic therapy is indicated.  Patient hemodynamic stable and appropriate for DC at this time.  Discussed strict return precautions.  Patient voices understanding and is agreeable to follow-up.     Final Clinical Impressions(s) / ED Diagnoses   Final diagnoses:  Bartholin's gland cyst    ED Discharge Orders    None       Cheikh Bramble A, PA-C 07/25/18 Harl Bowie, MD 07/27/18 [phone]

## 2018-07-25 NOTE — ED Notes (Signed)
Patient verbalizes understanding of discharge instructions . Opportunity for questions and answers were provided . Armband removed by staff ,Pt discharged from ED. W/C  offered at D/C  and Declined W/C at D/C and was escorted to lobby by RN.  

## 2018-07-26 ENCOUNTER — Ambulatory Visit (INDEPENDENT_AMBULATORY_CARE_PROVIDER_SITE_OTHER): Payer: Self-pay | Admitting: Obstetrics and Gynecology

## 2018-07-26 ENCOUNTER — Encounter: Payer: Self-pay | Admitting: Obstetrics and Gynecology

## 2018-07-26 VITALS — BP 109/72 | HR 94 | Wt 145.0 lb

## 2018-07-26 DIAGNOSIS — T859XXA Unspecified complication of internal prosthetic device, implant and graft, initial encounter: Secondary | ICD-10-CM

## 2018-07-26 DIAGNOSIS — N751 Abscess of Bartholin's gland: Secondary | ICD-10-CM | POA: Insufficient documentation

## 2018-07-26 NOTE — Progress Notes (Signed)
Was seen at [facility] yesterday for Bartholin's which was I&D and has catheter in place. Would like it removed "it is killing me"

## 2018-07-26 NOTE — Progress Notes (Signed)
S:  Alexandra Kennedy is a 38 y.o. female here in the office requesting the word catheter to be removed. She was seen in the ED 2 days ago and was told to f/u here today for removal. She says that the catheter is bothering her and she would like it removed.   O:  GENERAL: Well-developed, well-nourished female in no acute distress.  LUNGS: Effort normal SKIN: Warm, dry and without erythema PSYCH: Normal mood and affect PELVIC: Word catheter removed from right labia without difficulty. Patient tolerated it well. Incision is clean and draining small amount of purulent drainage. + Odor.   Today's Vitals   07/26/18 1827  BP: 109/72  Pulse: 94  Weight: 145 lb (65.8 kg)    A:  1. Bartholin's gland abscess   2. Complication of catheter, initial encounter     P:  Encouraged patient to keep word catheter in place with a goal of 4 weeks. Patient refused.  F/u as needed Continue sitz baths.    Duane Lope, NP 07/26/2018 7:18 PM

## 2018-11-21 ENCOUNTER — Encounter (HOSPITAL_COMMUNITY): Payer: Self-pay

## 2018-11-21 ENCOUNTER — Ambulatory Visit (HOSPITAL_COMMUNITY)
Admission: EM | Admit: 2018-11-21 | Discharge: 2018-11-21 | Disposition: A | Discharge status: Home / Self Care | Payer: Self-pay | Attending: Urgent Care | Admitting: Urgent Care

## 2018-11-21 ENCOUNTER — Other Ambulatory Visit: Payer: Self-pay

## 2018-11-21 DIAGNOSIS — R110 Nausea: Secondary | ICD-10-CM

## 2018-11-21 DIAGNOSIS — R42 Dizziness and giddiness: Secondary | ICD-10-CM

## 2018-11-21 DIAGNOSIS — R059 Cough, unspecified: Secondary | ICD-10-CM

## 2018-11-21 DIAGNOSIS — Z7251 High risk heterosexual behavior: Secondary | ICD-10-CM

## 2018-11-21 DIAGNOSIS — R05 Cough: Secondary | ICD-10-CM

## 2018-11-21 DIAGNOSIS — J029 Acute pharyngitis, unspecified: Secondary | ICD-10-CM

## 2018-11-21 DIAGNOSIS — N912 Amenorrhea, unspecified: Secondary | ICD-10-CM

## 2018-11-21 DIAGNOSIS — Z720 Tobacco use: Secondary | ICD-10-CM

## 2018-11-21 DIAGNOSIS — B349 Viral infection, unspecified: Secondary | ICD-10-CM

## 2018-11-21 LAB — POCT PREGNANCY, URINE: Preg Test, Ur: NEGATIVE

## 2018-11-21 NOTE — ED Provider Notes (Signed)
MRN: [id number] DOB: [birth year]  Subjective:   Alexandra Kennedy is a 38 y.o. female presenting for 2 day hx of dizziness, malaise, throat pain, chronic/intermittent dry cough. Patient is a smoker. She is sexually active, does not use condoms for protection. LMP was 10/18/2018. Has not tried any medications for relief.   No current facility-administered medications for this encounter.   Current Outpatient Medications:  .  naproxen sodium (ALEVE) 220 MG tablet, Take 220 mg by mouth., Disp: , Rfl:    No Known Allergies  History reviewed. No pertinent past medical history.   Past Surgical History:  Procedure Laterality Date  . TUBAL LIGATION      Review of Systems  Constitutional: Positive for malaise/fatigue. Negative for fever.  HENT: Positive for congestion and sore throat. Negative for ear pain and sinus pain.   Eyes: Negative for blurred vision, double vision, discharge and redness.  Respiratory: Positive for cough. Negative for hemoptysis, shortness of breath and wheezing.   Cardiovascular: Negative for chest pain.  Gastrointestinal: Positive for nausea. Negative for abdominal pain, diarrhea and vomiting.  Genitourinary: Negative for dysuria, flank pain and hematuria.  Musculoskeletal: Negative for myalgias.  Skin: Negative for rash.  Neurological: Negative for dizziness, weakness and headaches.  Psychiatric/Behavioral: Negative for depression and substance abuse.    Objective:   Vitals: BP 118/72 (BP Location: Left Arm)   Pulse 93   Temp 98.5 F (36.9 C) (Oral)   Resp 17   LMP 11/07/2018 (Approximate)   SpO2 100%   Physical Exam Constitutional:      General: She is not in acute distress.    Appearance: Normal appearance. She is well-developed. She is not ill-appearing, toxic-appearing or diaphoretic.  HENT:     Head: Normocephalic and atraumatic.     Right Ear: Tympanic membrane and ear canal normal. No drainage or tenderness. No middle ear effusion. Tympanic  membrane is not erythematous.     Left Ear: Tympanic membrane and ear canal normal. No drainage or tenderness.  No middle ear effusion. Tympanic membrane is not erythematous.     Nose: Nose normal. No congestion or rhinorrhea.     Mouth/Throat:     Mouth: Mucous membranes are moist. No oral lesions.     Pharynx: Oropharynx is clear. No pharyngeal swelling, oropharyngeal exudate, posterior oropharyngeal erythema or uvula swelling.     Tonsils: No tonsillar exudate or tonsillar abscesses.  Eyes:     Extraocular Movements: Extraocular movements intact.     Right eye: Normal extraocular motion.     Left eye: Normal extraocular motion.     Conjunctiva/sclera: Conjunctivae normal.     Pupils: Pupils are equal, round, and reactive to light.  Neck:     Musculoskeletal: Normal range of motion and neck supple.  Cardiovascular:     Rate and Rhythm: Normal rate and regular rhythm.     Pulses: Normal pulses.     Heart sounds: Normal heart sounds. No murmur. No friction rub. No gallop.   Pulmonary:     Effort: Pulmonary effort is normal. No respiratory distress.     Breath sounds: Normal breath sounds. No stridor. No wheezing, rhonchi or rales.  Lymphadenopathy:     Cervical: No cervical adenopathy.  Skin:    General: Skin is warm and dry.     Findings: No rash.  Neurological:     General: No focal deficit present.     Mental Status: She is alert and oriented to person, place, and time.  Psychiatric:  Mood and Affect: Mood normal.        Behavior: Behavior normal.        Thought Content: Thought content normal.     Results for orders placed or performed during the hospital encounter of 11/21/18 (from the past 24 hour(s))  Pregnancy, urine POC     Status: None   Collection Time: 11/21/18  6:24 PM  Result Value Ref Range   Preg Test, Ur NEGATIVE NEGATIVE    Assessment and Plan :   1. Viral illness   2. Nausea   3. Dizziness   4. Sore throat   5. Cough   6. Tobacco use   7.  Amenorrhea   8. Unprotected sex    Counseled patient on nature of COVID-19 including modes of transmission, diagnostic testing, management and supportive care. Testing is pending. Counseled patient on potential for adverse effects with medications prescribed/recommended today, ER and return-to-clinic precautions discussed, patient verbalized understanding.    Jaynee Eagles, [place] 11/21/18 1840

## 2018-11-21 NOTE — Discharge Instructions (Signed)
We will manage this as a viral syndrome. For sore throat or cough try using a honey-based tea. Use 3 teaspoons of honey with juice squeezed from half lemon. Place shaved pieces of ginger into 1/2-1 cup of water and warm over stove top. Then mix the ingredients and repeat every 4 hours as needed. Please take ibuprofen 400mg  every 6 hours alternating with OR taken together with Tylenol 500mg  every 6 hours. Hydrate very well with at least 2 liters of water. Eat light meals such as soups to replenish electrolytes and soft fruits, veggies. Start an antihistamine like Zyrtec, Allegra or Claritin for postnasal drainage, sinus congestion.  You can take this together with pseudoephedrine (Sudafed) at a dose of 60 mg 3 times a day oral 120 mg twice daily as needed for the same kind of congestion.  However, do not take Sudafed if you have high blood pressure or are prone to palpitations, have abnormal heart rhythms.

## 2018-11-21 NOTE — ED Triage Notes (Signed)
Patient presents to Urgent Care with complaints of dizziness, nausea, and sore throat since 2 days ago. Patient reports she has not been taking anything for her symptoms.

## 2018-11-22 ENCOUNTER — Telehealth: Payer: Self-pay | Admitting: *Deleted

## 2018-11-22 NOTE — Telephone Encounter (Signed)
Left voicemail for patient to return call to schedule COVID 19 test.

## 2018-11-22 NOTE — Telephone Encounter (Signed)
-----   Message from Jaynee Eagles, [place] sent at 11/21/2018  6:23 PM EDT ----- Regarding: Needs COVID testing

## 2019-01-19 ENCOUNTER — Encounter (HOSPITAL_COMMUNITY): Payer: Self-pay | Admitting: Emergency Medicine

## 2019-01-19 ENCOUNTER — Emergency Department (HOSPITAL_COMMUNITY)
Admission: EM | Admit: 2019-01-19 | Discharge: 2019-01-19 | Discharge status: Against Medical Advice | Payer: 59 | Attending: Emergency Medicine | Admitting: Emergency Medicine

## 2019-01-19 ENCOUNTER — Other Ambulatory Visit: Payer: Self-pay

## 2019-01-19 DIAGNOSIS — Z5321 Procedure and treatment not carried out due to patient leaving prior to being seen by health care provider: Secondary | ICD-10-CM | POA: Insufficient documentation

## 2019-01-19 DIAGNOSIS — N764 Abscess of vulva: Secondary | ICD-10-CM | POA: Diagnosis present

## 2019-01-19 NOTE — ED Triage Notes (Signed)
Pt arrives to ED from home with complaints of an abscess on her vagina foe three days. Patient also complains of a swollen right pointer finger.

## 2019-01-19 NOTE — ED Notes (Signed)
Registration reports pt has walked out saying she did not want to wait anymore.

## 2019-07-25 ENCOUNTER — Emergency Department (HOSPITAL_COMMUNITY): Payer: 59

## 2019-07-25 ENCOUNTER — Emergency Department (HOSPITAL_BASED_OUTPATIENT_CLINIC_OR_DEPARTMENT_OTHER): Payer: 59

## 2019-07-25 ENCOUNTER — Other Ambulatory Visit: Payer: Self-pay

## 2019-07-25 ENCOUNTER — Encounter (HOSPITAL_COMMUNITY): Payer: Self-pay | Admitting: Emergency Medicine

## 2019-07-25 ENCOUNTER — Emergency Department (HOSPITAL_COMMUNITY)
Admission: EM | Admit: 2019-07-25 | Discharge: 2019-07-25 | Disposition: A | Discharge status: Home / Self Care | Payer: 59 | Attending: Emergency Medicine | Admitting: Emergency Medicine

## 2019-07-25 DIAGNOSIS — M545 Low back pain, unspecified: Secondary | ICD-10-CM

## 2019-07-25 DIAGNOSIS — M79609 Pain in unspecified limb: Secondary | ICD-10-CM

## 2019-07-25 DIAGNOSIS — F17210 Nicotine dependence, cigarettes, uncomplicated: Secondary | ICD-10-CM | POA: Insufficient documentation

## 2019-07-25 DIAGNOSIS — M79662 Pain in left lower leg: Secondary | ICD-10-CM | POA: Diagnosis present

## 2019-07-25 DIAGNOSIS — M79605 Pain in left leg: Secondary | ICD-10-CM | POA: Diagnosis not present

## 2019-07-25 MED ORDER — PREDNISONE 10 MG (21) PO TBPK: ORAL_TABLET | Freq: Every day | ORAL | 0 refills | Status: DC

## 2019-07-25 MED ORDER — HYDROCODONE-ACETAMINOPHEN 5-325 MG PO TABS
1.0000 | ORAL_TABLET | Freq: Once | ORAL | Status: AC
  Administered 2019-07-25: 1 via ORAL
  Filled 2019-07-25: qty 1

## 2019-07-25 MED ORDER — KETOROLAC TROMETHAMINE 30 MG/ML IJ SOLN
30.0000 mg | Freq: Once | INTRAMUSCULAR | Status: AC
  Administered 2019-07-25: 30 mg via INTRAMUSCULAR
  Filled 2019-07-25: qty 1

## 2019-07-25 MED ORDER — CYCLOBENZAPRINE HCL 10 MG PO TABS: 10.0000 mg | ORAL_TABLET | Freq: Two times a day (BID) | ORAL | 0 refills | Status: AC | PRN

## 2019-07-25 NOTE — Discharge Instructions (Signed)
1. Medications: Start taking steroid taper as prescribed.  Do not take ibuprofen, Advil, Motrin, or Aleve while taking this medication.  You can take 1 to 2 tablets of Tylenol every 6 hours additionally however.  When you are done taking the steroid taper you can alternate 600 mg of ibuprofen and [phone] mg of Tylenol every 3 hours as needed for pain. Do not exceed 4000 mg of Tylenol daily.  Take ibuprofen with food to avoid upset stomach issues.  You can take Flexeril as needed for muscle spasm up to twice daily but do not drive, drink alcohol, or operate heavy machinery while taking this medicine because it may make you drowsy.  I typically recommend taking this medicine only at night when you are going to sleep.  You can also cut these tablets in half if they make you feel very drowsy.  Apply lidocaine patches to areas of pain.   2. Treatment: rest, drink plenty of fluids, gentle stretching as discussed (see attached or you can YouTube search lumbar physical therapy exercises or hip physical therapy exercises), alternate ice and heat (or stick with whichever feels best) 20 minutes on 20 minutes off. 3. Follow Up: Please followup with your primary doctor in 3-7 days or an orthopedist for discussion of your diagnoses and further evaluation after today's visit; return to the ER for worsening back pain, difficulty walking, loss of bowel or bladder control or other concerning symptoms

## 2019-07-25 NOTE — ED Provider Notes (Signed)
[facility] EMERGENCY DEPARTMENT Provider Note   CSN: [id number] Arrival date & time: 07/25/19  1251     History Chief Complaint  Patient presents with   Leg Pain    Alexandra Kennedy is a 39 y.o. female presents for evaluation of acute onset, progressively worsening left lower extremity pain for 2 weeks.  Reports that the pain is constant, sharp, became very severe yesterday.  No known trauma.  It radiates from around the low back and hip down the thigh and into the knee but does not past the knee.  Denies numbness or tingling.  Pain worsens with ambulation and sitting.  Also has worsening pain with going from sitting to standing.  Reports that she has been sitting for long periods more due to working from home.  Denies fevers.  Has been taking ibuprofen and Flexeril with temporary relief.  Reports that she has never had pain like this before.  She denies any recent travel or surgeries, no prior history of DVT or PE, not on hormone replacement therapy.  She reports that she has not noticed any leg swelling but she feels as though her left thigh is "tight".  The history is provided by the patient.       History reviewed. No pertinent past medical history.  Patient Active Problem List   Diagnosis Date Noted   Bartholin's gland abscess 32/35/5732   Complication of catheter 07/26/2018    Past Surgical History:  Procedure Laterality Date   TUBAL LIGATION       OB History    Gravida  5   Para  4   Term  4   Preterm      AB  1   Living  4     SAB  1   TAB      Ectopic      Multiple      Live Births  4           Family History  Problem Relation Age of Onset   Diabetes Mother     Social History   Tobacco Use   Smoking status: Current Every Day Smoker    Types: Cigarettes   Smokeless tobacco: Never Used  Substance Use Topics   Alcohol use: Yes   Drug use: Not Currently    Home Medications Prior to Admission medications     Medication Sig Start Date End Date Taking? Authorizing Provider  ibuprofen (ADVIL) 200 MG tablet Take 1,000 mg by mouth every 6 (six) hours as needed for moderate pain.   Yes [provider]  cyclobenzaprine (FLEXERIL) 10 MG tablet Take 1 tablet (10 mg total) by mouth 2 (two) times daily as needed for muscle spasms. 07/25/19   Kourtni Stineman A, PA-C  predniSONE (STERAPRED UNI-PAK 21 TAB) 10 MG (21) TBPK tablet Take by mouth daily. Take 6 tabs by mouth daily  for 2 days, then 5 tabs for 2 days, then 4 tabs for 2 days, then 3 tabs for 2 days, 2 tabs for 2 days, then 1 tab by mouth daily for 2 days 07/25/19   Rodell Perna A, PA-C    Allergies    Patient has no known allergies.  Review of Systems   Review of Systems  Constitutional: Negative for chills and fever.  Musculoskeletal: Positive for arthralgias and back pain.  Neurological: Negative for weakness and numbness.    Physical Exam Updated Vital Signs BP 130/85 (BP Location: Right Arm)    Pulse 95  Temp 98 F (36.7 C) (Oral)    Resp 20    LMP 07/18/2019 (Exact Date) Comment: Pregnancy test waiver signed 3/8    SpO2 100%   Physical Exam Vitals and nursing note reviewed.  Constitutional:      General: She is not in acute distress.    Appearance: She is well-developed.     Comments: Sitting upright in chair, appears uncomfortable  HENT:     Head: Normocephalic and atraumatic.  Eyes:     General:        Right eye: No discharge.        Left eye: No discharge.     Conjunctiva/sclera: Conjunctivae normal.  Neck:     Vascular: No JVD.     Trachea: No tracheal deviation.  Cardiovascular:     Rate and Rhythm: Normal rate.     Pulses: Normal pulses.     Comments: 2+ DP/PT pulses bilaterally, Homans sign absent bilaterally, no lower extremity edema, no palpable cords, compartments are soft  Pulmonary:     Effort: Pulmonary effort is normal.  Abdominal:     General: There is no distension.  Musculoskeletal:        General:  Tenderness present.     Comments: Diffuse tenderness to palpation of the lumbar spine with bilateral paralumbar muscle tenderness.  Left SI joint tenderness present.  Some tenderness to palpation of the left anterior hip.  Normal passive range of motion of the left hip with pain elicited with flexion and external rotation.  No erythema or swelling noted.  5/5 strength of BLE major muscle groups.  Skin:    General: Skin is warm and dry.     Capillary Refill: Capillary refill takes less than 2 seconds.     Findings: No erythema.  Neurological:     Mental Status: She is alert.     Comments: Sensation intact to light touch of bilateral lower extremities.  Ambulatory with antalgic gait but exhibits good balance.  Psychiatric:        Behavior: Behavior normal.     ED Results / Procedures / Treatments   Labs (all labs ordered are listed, but only abnormal results are displayed) Labs Reviewed - No data to display  EKG None  Radiology DG Lumbar Spine Complete  Result Date: 07/25/2019 CLINICAL DATA:  Pain in left leg and lower back. EXAM: LUMBAR SPINE - COMPLETE 4+ VIEW COMPARISON:  None FINDINGS: There is no evidence of lumbar spine fracture. Alignment is normal. Intervertebral disc spaces are maintained. Incidental note is made of bilateral tubal ligation clips. IMPRESSION: Negative evaluation of the lumbar spine Electronically Signed   By: Donzetta Kohut M.D.   On: 07/25/2019 15:35   DG Hip Unilat With Pelvis 2-3 Views Left  Result Date: 07/25/2019 CLINICAL DATA:  Pain EXAM: DG HIP (WITH OR WITHOUT PELVIS) 2-3V LEFT COMPARISON:  None. FINDINGS: Frontal pelvis as well as frontal and lateral left hip images obtained. No fracture or dislocation. Joint spaces appear normal. No erosive change. Tubal ligation clips present bilaterally. IMPRESSION: No fracture or dislocation.  No evident arthropathy. Electronically Signed   By: Bretta Bang III M.D.   On: 07/25/2019 15:33   VAS [place] LOWER EXTREMITY  VENOUS (DVT) ([facility] and [facility] 7a-7p)  Result Date: 07/25/2019  Lower Venous DVTStudy Indications: Pain.  Comparison Study: No prior study Performing Technologist: Gertie Fey MHA, RDMS, RVT, RDCS  Examination Guidelines: A complete evaluation includes B-mode imaging, spectral Doppler, color Doppler, and power Doppler as  needed of all accessible portions of each vessel. Bilateral testing is considered an integral part of a complete examination. Limited examinations for reoccurring indications may be performed as noted. The reflux portion of the exam is performed with the patient in reverse Trendelenburg.  +---------+---------------+---------+-----------+----------+--------------+  LEFT      Compressibility Phasicity Spontaneity Properties Thrombus Aging  +---------+---------------+---------+-----------+----------+--------------+  CFV       Full            Yes       Yes                                    +---------+---------------+---------+-----------+----------+--------------+  SFJ       Full                                                             +---------+---------------+---------+-----------+----------+--------------+  FV Prox   Full                                                             +---------+---------------+---------+-----------+----------+--------------+  FV Mid    Full                                                             +---------+---------------+---------+-----------+----------+--------------+  FV Distal Full                                                             +---------+---------------+---------+-----------+----------+--------------+  PFV       Full                                                             +---------+---------------+---------+-----------+----------+--------------+  POP       Full            Yes       Yes                                    +---------+---------------+---------+-----------+----------+--------------+  PTV       Full                                                              +---------+---------------+---------+-----------+----------+--------------+  PERO      Full                                                             +---------+---------------+---------+-----------+----------+--------------+     Summary: LEFT: - There is no evidence of deep vein thrombosis in the lower extremity.  - No cystic structure found in the popliteal fossa.  *See table(s) above for measurements and observations.    Preliminary     Procedures Procedures (including critical care time)  Medications Ordered in ED Medications  HYDROcodone-acetaminophen (NORCO/VICODIN) 5-325 MG per tablet 1 tablet (1 tablet Oral Given 07/25/19 1534)  ketorolac (TORADOL) 30 MG/ML injection 30 mg (30 mg Intramuscular Given 07/25/19 1643)    ED Course  I have reviewed the triage vital signs and the nursing notes.  Pertinent labs & imaging results that were available during my care of the patient were reviewed by me and considered in my medical decision making (see chart for details).    MDM Rules/Calculators/A&P                      Patient presenting for evaluation of progressively worsening left lower back and hip pain radiating to the left thigh.  She is afebrile, vital signs are stable.  She is nontoxic but uncomfortable in appearance.  She is neurovascularly intact.  Compartments are soft on examination.  She is ambulatory with some difficulty due to pain.  No red flag signs concerning for cauda equina or spinal abscess.  Radiographs obtained show no evidence of acute osseous abnormality, no fracture or dislocation.  A DVT study was obtained which was negative.  Doubt septic arthritis, osteomyelitis, or gout.  On reevaluation patient resting more comfortably.  Her pain was managed with hydrocodone and Toradol in the ED.  We discussed conservative therapy and management for what I suspect is likely musculoskeletal pain with steroid taper, Flexeril, gentle stretching and ice/heat  therapy.  Discussed appropriate use of medications.  Recommend follow-up with PCP or orthopedist for reevaluation of symptoms.  Discussed strict ED return precautions. Patient verbalized understanding of and agreement with plan and is safe for discharge home at this time.    Final Clinical Impression(s) / ED Diagnoses Final diagnoses:  Acute leg pain, left  Acute left-sided low back pain, unspecified whether sciatica present    Rx / DC Orders ED Discharge Orders         Ordered    predniSONE (STERAPRED UNI-PAK 21 TAB) 10 MG (21) TBPK tablet  Daily     07/25/19 1709    cyclobenzaprine (FLEXERIL) 10 MG tablet  2 times daily PRN     07/25/19 1709           Jeanie Sewer, PA-C 07/25/19 1715    Margarita Grizzle, MD 07/25/19 1900

## 2019-07-25 NOTE — ED Notes (Signed)
Pt stated that she will sign paper that she is not preg

## 2019-07-25 NOTE — Progress Notes (Signed)
Left lower extremity venous duplex completed. Refer to "CV Proc" under chart review to view preliminary results.  07/25/2019 3:43 PM Eula Fried., MHA, RVT, RDCS, RDMS

## 2019-07-25 NOTE — ED Triage Notes (Signed)
Pt c/o left leg pain. Denies injury/fall. Ambulatory without difficulty.

## 2019-07-25 NOTE — ED Notes (Signed)
Pt verbalized understanding of discharge instructions. Pain management, follow up care and prescriptions reviewed. Pt had no further questions.
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
## 2019-07-25 NOTE — ED Notes (Signed)
Pt transported to Xray. 

## 2019-07-25 NOTE — ED Notes (Signed)
Vascular at bedside

## 2020-03-09 DIAGNOSIS — Z6823 Body mass index (BMI) 23.0-23.9, adult: Secondary | ICD-10-CM | POA: Diagnosis not present

## 2020-03-09 DIAGNOSIS — Z0000 Encounter for general adult medical examination without abnormal findings: Secondary | ICD-10-CM | POA: Diagnosis not present

## 2020-03-09 DIAGNOSIS — Z1151 Encounter for screening for human papillomavirus (HPV): Secondary | ICD-10-CM | POA: Diagnosis not present

## 2020-03-09 DIAGNOSIS — Z113 Encounter for screening for infections with a predominantly sexual mode of transmission: Secondary | ICD-10-CM | POA: Diagnosis not present

## 2020-03-09 DIAGNOSIS — Z1159 Encounter for screening for other viral diseases: Secondary | ICD-10-CM | POA: Diagnosis not present

## 2020-03-09 DIAGNOSIS — Z13220 Encounter for screening for lipoid disorders: Secondary | ICD-10-CM | POA: Diagnosis not present

## 2020-03-09 DIAGNOSIS — Z1329 Encounter for screening for other suspected endocrine disorder: Secondary | ICD-10-CM | POA: Diagnosis not present

## 2020-03-09 DIAGNOSIS — Z01411 Encounter for gynecological examination (general) (routine) with abnormal findings: Secondary | ICD-10-CM | POA: Diagnosis not present

## 2020-03-09 DIAGNOSIS — Z114 Encounter for screening for human immunodeficiency virus [HIV]: Secondary | ICD-10-CM | POA: Diagnosis not present

## 2020-03-09 DIAGNOSIS — N898 Other specified noninflammatory disorders of vagina: Secondary | ICD-10-CM | POA: Diagnosis not present

## 2020-03-09 DIAGNOSIS — Z720 Tobacco use: Secondary | ICD-10-CM | POA: Diagnosis not present

## 2020-03-09 DIAGNOSIS — N926 Irregular menstruation, unspecified: Secondary | ICD-10-CM | POA: Diagnosis not present

## 2020-03-09 DIAGNOSIS — Z131 Encounter for screening for diabetes mellitus: Secondary | ICD-10-CM | POA: Diagnosis not present

## 2020-03-09 DIAGNOSIS — Z130 Encounter for screening for diseases of the blood and blood-forming organs and certain disorders involving the immune mechanism: Secondary | ICD-10-CM | POA: Diagnosis not present

## 2020-03-09 DIAGNOSIS — Z01419 Encounter for gynecological examination (general) (routine) without abnormal findings: Secondary | ICD-10-CM | POA: Diagnosis not present

## 2020-03-15 DIAGNOSIS — A5400 Gonococcal infection of lower genitourinary tract, unspecified: Secondary | ICD-10-CM | POA: Diagnosis not present

## 2020-05-09 DIAGNOSIS — Z8619 Personal history of other infectious and parasitic diseases: Secondary | ICD-10-CM | POA: Diagnosis not present

## 2020-05-09 DIAGNOSIS — A749 Chlamydial infection, unspecified: Secondary | ICD-10-CM | POA: Diagnosis not present

## 2020-05-09 DIAGNOSIS — A549 Gonococcal infection, unspecified: Secondary | ICD-10-CM | POA: Diagnosis not present

## 2020-11-15 ENCOUNTER — Emergency Department (HOSPITAL_COMMUNITY)
Admission: EM | Admit: 2020-11-15 | Discharge: 2020-11-16 | Disposition: A | Discharge status: Home / Self Care | Payer: BC Managed Care – PPO | Attending: Emergency Medicine | Admitting: Emergency Medicine

## 2020-11-15 ENCOUNTER — Other Ambulatory Visit: Payer: Self-pay

## 2020-11-15 DIAGNOSIS — M25561 Pain in right knee: Secondary | ICD-10-CM | POA: Diagnosis not present

## 2020-11-15 DIAGNOSIS — M25512 Pain in left shoulder: Secondary | ICD-10-CM | POA: Insufficient documentation

## 2020-11-15 DIAGNOSIS — M545 Low back pain, unspecified: Secondary | ICD-10-CM | POA: Insufficient documentation

## 2020-11-15 DIAGNOSIS — F17210 Nicotine dependence, cigarettes, uncomplicated: Secondary | ICD-10-CM | POA: Diagnosis not present

## 2020-11-15 DIAGNOSIS — Y92410 Unspecified street and highway as the place of occurrence of the external cause: Secondary | ICD-10-CM | POA: Insufficient documentation

## 2020-11-15 DIAGNOSIS — M25562 Pain in left knee: Secondary | ICD-10-CM | POA: Diagnosis not present

## 2020-11-16 ENCOUNTER — Other Ambulatory Visit: Payer: Self-pay

## 2020-11-16 ENCOUNTER — Encounter (HOSPITAL_COMMUNITY): Payer: Self-pay

## 2020-11-16 ENCOUNTER — Emergency Department (HOSPITAL_COMMUNITY): Payer: BC Managed Care – PPO

## 2020-11-16 LAB — POC URINE PREG, ED: Preg Test, Ur: NEGATIVE

## 2020-11-16 MED ORDER — NAPROXEN 500 MG PO TABS
500.0000 mg | ORAL_TABLET | Freq: Once | ORAL | Status: AC
  Administered 2020-11-16: 500 mg via ORAL
  Filled 2020-11-16: qty 1

## 2020-11-16 MED ORDER — LIDOCAINE 5 % EX PTCH: 1.0000 | MEDICATED_PATCH | CUTANEOUS | 0 refills | Status: AC

## 2020-11-16 MED ORDER — ACETAMINOPHEN 500 MG PO TABS
1000.0000 mg | ORAL_TABLET | Freq: Once | ORAL | Status: AC
  Administered 2020-11-16: 1000 mg via ORAL
  Filled 2020-11-16: qty 2

## 2020-11-16 MED ORDER — NAPROXEN 500 MG PO TABS: 500.0000 mg | ORAL_TABLET | Freq: Two times a day (BID) | ORAL | 0 refills | Status: AC
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
## 2021-07-19 ENCOUNTER — Emergency Department (HOSPITAL_COMMUNITY)
Admission: EM | Admit: 2021-07-19 | Discharge: 2021-07-20 | Discharge status: Against Medical Advice | Payer: BC Managed Care – PPO

## 2021-07-19 NOTE — ED Notes (Signed)
Pt is leaving stated she is coming back ?
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
## 2024-02-10 ENCOUNTER — Encounter: Payer: Self-pay | Admitting: Physician Assistant

## 2024-02-10 ENCOUNTER — Ambulatory Visit: Admitting: Physician Assistant

## 2024-02-10 VITALS — BP 102/76 | HR 82 | Ht 65.5 in | Wt 167.0 lb

## 2024-02-10 DIAGNOSIS — Z113 Encounter for screening for infections with a predominantly sexual mode of transmission: Secondary | ICD-10-CM

## 2024-02-10 DIAGNOSIS — L309 Dermatitis, unspecified: Secondary | ICD-10-CM

## 2024-02-10 DIAGNOSIS — Z1231 Encounter for screening mammogram for malignant neoplasm of breast: Secondary | ICD-10-CM

## 2024-02-10 DIAGNOSIS — Z13220 Encounter for screening for lipoid disorders: Secondary | ICD-10-CM

## 2024-02-10 DIAGNOSIS — F17210 Nicotine dependence, cigarettes, uncomplicated: Secondary | ICD-10-CM

## 2024-02-10 DIAGNOSIS — Z0000 Encounter for general adult medical examination without abnormal findings: Secondary | ICD-10-CM

## 2024-02-10 MED ORDER — TRIAMCINOLONE ACETONIDE 0.1 % EX CREA: TOPICAL_CREAM | Freq: Two times a day (BID) | CUTANEOUS | 0 refills | Status: AC

## 2024-02-10 MED ORDER — METHYLPREDNISOLONE 4 MG PO TBPK: ORAL_TABLET | ORAL | 0 refills | Status: AC

## 2024-02-10 NOTE — Progress Notes (Unsigned)
 New Patient Office Visit  Subjective    Patient ID: Alexandra Kennedy, female    DOB: Dec 17, 1980  Age: 43 y.o. MRN: [id number]  CC:  Chief Complaint  Patient presents with   Rash    Skin rash on left wrist.    std screening     HIV    Discussed the use of AI scribe software for clinical note transcription with the patient, who gave verbal consent to proceed.  History of Present Illness   Alexandra Kennedy is a 43 year old female who presents with a persistent rash on her left wrist.  The rash on her left wrist has persisted for over a year. Initially diagnosed as eczema, a topical steroid cream provided minimal relief, slightly reducing inflammation but not resolving the rash. The rash is itchy, especially in the morning, and Vaseline helps reduce swelling but not redness. The rash began after a tattoo cover-up, raising suspicion of a reaction to the ink. It has not spread except for a small dry patch on her pinky finger, which is non-itchy and managed with Vaseline.  She is aware of her anemia from past lab results but has not taken iron supplements recently due to adverse effects, including increased menstrual bleeding and discomfort. She last attempted iron supplements two years ago but discontinued due to these side effects.  She is interested in screening for sexually transmitted diseases, specifically HIV and syphilis, through blood tests. She has no current symptoms such as discharge, sores, or known exposures.   Outpatient Encounter Medications as of 02/10/2024  Medication Sig   methylPREDNISolone  (MEDROL  DOSEPAK) 4 MG TBPK tablet Use per instructions on package   triamcinolone  cream (KENALOG ) 0.1 % Apply topically 2 (two) times daily.   cyclobenzaprine  (FLEXERIL ) 10 MG tablet Take 1 tablet (10 mg total) by mouth 2 (two) times daily as needed for muscle spasms.   ibuprofen (ADVIL) 200 MG tablet Take 1,000 mg by mouth every 6 (six) hours as needed for moderate pain.   lidocaine   (LIDODERM ) 5 % Place 1 patch onto the skin daily. Remove & Discard patch within 12 hours or as directed by MD   naproxen  (NAPROSYN ) 500 MG tablet Take 1 tablet (500 mg total) by mouth 2 (two) times daily with a meal.   [DISCONTINUED] predniSONE  (STERAPRED UNI-PAK 21 TAB) 10 MG (21) TBPK tablet Take by mouth daily. Take 6 tabs by mouth daily  for 2 days, then 5 tabs for 2 days, then 4 tabs for 2 days, then 3 tabs for 2 days, 2 tabs for 2 days, then 1 tab by mouth daily for 2 days   No facility-administered encounter medications on file as of 02/10/2024.    No past medical history on file.  Past Surgical History:  Procedure Laterality Date   TUBAL LIGATION      Family History  Problem Relation Age of Onset   Diabetes Mother     Social History   Socioeconomic History   Marital status: Divorced    Spouse name: Not on file   Number of children: Not on file   Years of education: Not on file   Highest education level: Not on file  Occupational History   Not on file  Tobacco Use   Smoking status: Every Day    Types: Cigarettes   Smokeless tobacco: Never  Vaping Use   Vaping status: Never Used  Substance and Sexual Activity   Alcohol use: Yes   Drug use: Not Currently  Sexual activity: Not Currently    Birth control/protection: None  Other Topics Concern   Not on file  Social History Narrative   Not on file   Social Drivers of Health   Financial Resource Strain: Not on file  Food Insecurity: Not on file  Transportation Needs: Not on file  Physical Activity: Not on file  Stress: Not on file  Social Connections: Not on file  Intimate Partner Violence: Not on file    Review of Systems  Constitutional: Negative.   HENT: Negative.    Eyes: Negative.   Respiratory:  Negative for shortness of breath.   Cardiovascular:  Negative for chest pain.  Gastrointestinal: Negative.   Genitourinary: Negative.   Musculoskeletal: Negative.   Skin:  Positive for itching and rash.   Neurological: Negative.   Endo/Heme/Allergies: Negative.   Psychiatric/Behavioral: Negative.          Objective    BP 102/76 (BP Location: Left Arm, Patient Position: Sitting, Cuff Size: Large)   Pulse 82   Ht 5' 5.5 (1.664 m)   Wt 167 lb (75.8 kg)   LMP 02/08/2024   SpO2 95%   BMI 27.37 kg/m   Physical Exam Vitals and nursing note reviewed.     GENERAL: Alert, cooperative, well developed, no acute distress. HEENT: Normocephalic, normal oropharynx, moist mucous membranes. CHEST: Clear to auscultation bilaterally. No wheezes, rhonchi, or crackles. CARDIOVASCULAR: Normal heart rate and rhythm. S1 and S2 normal without murmurs. ABDOMEN: Soft, non-tender, non-distended, without organomegaly. Normal bowel sounds. EXTREMITIES: No cyanosis or edema. NEUROLOGICAL: Cranial nerves grossly intact. Moves all extremities without gross motor or sensory deficit. SKIN: Rash on left wrist and pinky finger, see photos          Assessment & Plan:   Problem List Items Addressed This Visit   None Visit Diagnoses       Eczema, unspecified type    -  Primary   Relevant Medications   methylPREDNISolone  (MEDROL  DOSEPAK) 4 MG TBPK tablet   triamcinolone  cream (KENALOG ) 0.1 %     Screen for sexually transmitted diseases       Relevant Orders   HIV antibody (with reflex) (Completed)   RPR (Completed)     Encounter for screening mammogram for malignant neoplasm of breast       Relevant Orders   MM 3D SCREENING MAMMOGRAM BILATERAL BREAST     Screening, lipid       Relevant Orders   Lipid panel (Completed)     Wellness examination       Relevant Orders   CBC with Differential/Platelet (Completed)   Comp. Metabolic Panel (12) (Completed)     1. Eczema, unspecified type (Primary) Trial Medrol  Dosepak, Kenalog .  Patient education given on supportive care.  Red flags given for prompt reevaluation - methylPREDNISolone  (MEDROL  DOSEPAK) 4 MG TBPK tablet; Use per instructions on  package  Dispense: 21 tablet; Refill: 0 - triamcinolone  cream (KENALOG ) 0.1 %; Apply topically 2 (two) times daily.  Dispense: 45 g; Refill: 0  2. Screen for sexually transmitted diseases  - HIV antibody (with reflex) - RPR  3. Encounter for screening mammogram for malignant neoplasm of breast  - MM 3D SCREENING MAMMOGRAM BILATERAL BREAST; Future  4. Screening, lipid  - Lipid panel  5. Wellness examination Patient given appointment to establish care at [facility] with Differential/Platelet - Comp. Metabolic Panel (12)   I have reviewed the patient's medical history (PMH, PSH, Social History, Family History, Medications,  and allergies) , and have been updated if relevant. I spent 30 minutes reviewing chart and  face to face time with patient.     Return if symptoms worsen or fail to improve.   Kirk RAMAN Mayers, PA-C

## 2024-02-10 NOTE — Patient Instructions (Signed)
 You are going to use a steroid taper and steroid cream to help with your rash.    We will notify you with todays lab results  [facility] will reach out to [facility] to schedule your mammogram  Kirk CANDIE Sage, PA-C Physician Assistant [facility] [email or web address]  Inflamed Skin (Atopic Dermatitis): What to Know Atopic dermatitis is a skin condition that causes dry, itchy, and inflamed skin. It's the most common type of eczema, which is a group of skin conditions that make your skin feel rough and puffy. This condition often gets worse in the winter and better in the summer. Atopic dermatitis usually starts in childhood and can last into adulthood. It's not contagious, so it does not spread from person to person. Your symptoms may get worse when you're having a flare-up. During a flare-up, your symptoms may get worse and bother you. What are the causes? The exact cause of this condition isn't known. Flare-ups can be triggered by: Contact with things you're sensitive or allergic to. Stress. Some foods. Very hot or cold weather. Harsh chemicals and soaps. Dry air. Chlorine. What increases the risk? You're more likely to get this condition if you have a personal or family history of: Eczema. Allergies. Asthma. Hay fever. What are the signs or symptoms?  Dry, scaly skin. Red, brown, purple, or grayish rash. Itchiness. Thick and cracked skin over time. How is this diagnosed? This condition is diagnosed based on: Symptoms. Physical exam. Medical history. How is this treated? There's no cure for this condition, but you can manage your symptoms. Do this by: Controlling your itchiness and scratching with antihistamine medicine or steroid creams. Avoiding allergens or triggers. Managing stress. Trying light therapy, also called phototherapy if other treatments don't work or if it's all over your body. Follow these  instructions at home: Skin care  Keep your skin hydrated. To do this: Use unscented lotions that contain petroleum. Avoid lotions with alcohol or water. These can dry out your skin more. Take short baths or showers (less than 5 minutes). Use warm water instead of hot water. Use mild, unscented soaps. Avoid bubble bath. Put lotion on right after bathing. Do not put anything on your skin without checking with your health care provider. General instructions Take or apply your medicines only as told. Wear clothes made of cotton or cotton blends. Dress lightly to avoid itching that can be caused by heat. When doing laundry, rinse your clothes twice to remove all soap. Use soap that doesn't have dyes and perfumes. Avoid triggers that cause flare-ups. Avoid scratching. It can make the rash and itching worse and can lead to infection. Keep fingernails short to avoid scratching open the skin. Avoid people who have cold sores or fever blisters. These infections can make your condition worse. Keep all follow-up visits to make sure your treatment plan is working. Contact a health care provider if: Your itching affects your sleep. Your rash gets worse or doesn't get better after a week of treatment. You have a fever. You have a rash after being around someone with cold sores or fever blisters. You have warmth or pus in the rash area. You have soft yellow scabs in the rash area. This information is not intended to replace advice given to you by your health care provider. Make sure you discuss any questions you have with your health care provider. Document Revised: 10/07/2022 Document Reviewed: 10/07/2022 Elsevier Patient Education  2024 [employer].

## 2024-02-11 ENCOUNTER — Ambulatory Visit: Payer: Self-pay | Admitting: Physician Assistant

## 2024-02-11 ENCOUNTER — Encounter: Payer: Self-pay | Admitting: Physician Assistant

## 2024-02-11 LAB — CBC WITH DIFFERENTIAL/PLATELET
Basophils Absolute: 0 x10E3/uL (ref 0.0–0.2)
Basos: 1 %
EOS (ABSOLUTE): 0.1 x10E3/uL (ref 0.0–0.4)
Eos: 2 %
Hematocrit: 37.8 % (ref 34.0–46.6)
Hemoglobin: 12.5 g/dL (ref 11.1–15.9)
Immature Grans (Abs): 0 x10E3/uL (ref 0.0–0.1)
Immature Granulocytes: 0 %
Lymphocytes Absolute: 1.7 x10E3/uL (ref 0.7–3.1)
Lymphs: 24 %
MCH: 33 pg (ref 26.6–33.0)
MCHC: 33.1 g/dL (ref 31.5–35.7)
MCV: 100 fL — ABNORMAL HIGH (ref 79–97)
Monocytes Absolute: 0.5 x10E3/uL (ref 0.1–0.9)
Monocytes: 7 %
Neutrophils Absolute: 4.8 x10E3/uL (ref 1.4–7.0)
Neutrophils: 66 %
Platelets: 298 x10E3/uL (ref 150–450)
RBC: 3.79 x10E6/uL (ref 3.77–5.28)
RDW: 12.7 % (ref 11.7–15.4)
WBC: 7.2 x10E3/uL (ref 3.4–10.8)

## 2024-02-11 LAB — COMP. METABOLIC PANEL (12)
AST: 13 IU/L (ref 0–40)
Albumin: 4.5 g/dL (ref 3.9–4.9)
Alkaline Phosphatase: 38 IU/L — ABNORMAL LOW (ref 41–116)
BUN/Creatinine Ratio: 25 — ABNORMAL HIGH (ref 9–23)
BUN: 13 mg/dL (ref 6–24)
Bilirubin Total: <0.2 mg/dL (ref 0.0–1.2)
Calcium: 9.4 mg/dL (ref 8.7–10.2)
Chloride: 102 mmol/L (ref 96–106)
Creatinine, Ser: 0.51 mg/dL — ABNORMAL LOW (ref 0.57–1.00)
Globulin, Total: 2.4 g/dL (ref 1.5–4.5)
Glucose: 110 mg/dL — ABNORMAL HIGH (ref 70–99)
Potassium: 4.3 mmol/L (ref 3.5–5.2)
Sodium: 139 mmol/L (ref 134–144)
Total Protein: 6.9 g/dL (ref 6.0–8.5)
eGFR: 120 mL/min/1.73 (ref >59)

## 2024-02-11 LAB — RPR: RPR Ser Ql: NONREACTIVE

## 2024-02-11 LAB — LIPID PANEL
Chol/HDL Ratio: 2.6 ratio (ref 0.0–4.4)
Cholesterol, Total: 153 mg/dL (ref 100–199)
HDL: 58 mg/dL (ref >39)
LDL Chol Calc (NIH): 74 mg/dL (ref 0–99)
Triglycerides: 121 mg/dL (ref 0–149)
VLDL Cholesterol Cal: 21 mg/dL (ref 5–40)

## 2024-02-11 LAB — HIV ANTIBODY (ROUTINE TESTING W REFLEX): HIV Screen 4th Generation wRfx: NONREACTIVE

## 2024-03-07 ENCOUNTER — Other Ambulatory Visit: Payer: Self-pay | Admitting: Obstetrics and Gynecology

## 2024-03-07 DIAGNOSIS — R928 Other abnormal and inconclusive findings on diagnostic imaging of breast: Secondary | ICD-10-CM

## 2024-03-11 ENCOUNTER — Ambulatory Visit: Payer: Self-pay

## 2024-03-18 ENCOUNTER — Ambulatory Visit
Admission: RE | Admit: 2024-03-18 | Discharge: 2024-03-18 | Disposition: A | Discharge status: Home / Self Care | Payer: Self-pay | Source: Ambulatory Visit | Attending: Obstetrics and Gynecology | Admitting: Obstetrics and Gynecology

## 2024-03-18 DIAGNOSIS — R928 Other abnormal and inconclusive findings on diagnostic imaging of breast: Secondary | ICD-10-CM

## 2024-03-30 ENCOUNTER — Ambulatory Visit: Payer: Self-pay | Admitting: Family Medicine

## 2024-03-30 ENCOUNTER — Telehealth: Payer: Self-pay | Admitting: Family Medicine

## 2024-03-30 NOTE — Telephone Encounter (Signed)
 Called patient about missed appt 11/12. No answer, left message.
